# Patient Record
Sex: Female | Born: 1990 | Race: Black or African American | Hispanic: No | Marital: Single | Smoking: Never smoker
Health system: Southern US, Community
[De-identification: ages and names within clinical notes are randomized; demographics above are authoritative.]

## PROBLEM LIST (undated history)

## (undated) DIAGNOSIS — M329 Systemic lupus erythematosus, unspecified: Secondary | ICD-10-CM

## (undated) DIAGNOSIS — IMO0002 Reserved for concepts with insufficient information to code with codable children: Secondary | ICD-10-CM

## (undated) DIAGNOSIS — R011 Cardiac murmur, unspecified: Secondary | ICD-10-CM

## (undated) DIAGNOSIS — D571 Sickle-cell disease without crisis: Secondary | ICD-10-CM

## (undated) HISTORY — DX: Sickle-cell disease without crisis: D57.1

## (undated) HISTORY — PX: CHOLECYSTECTOMY: SHX55

---
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
## 2010-06-03 LAB — CBC
HCT: 17.6 % — ABNORMAL LOW (ref 36.0–46.0)
MCHC: 36.4 g/dL — ABNORMAL HIGH (ref 30.0–36.0)
Platelets: 335 10*3/uL (ref 150–400)
RDW: 23.4 % — ABNORMAL HIGH (ref 11.5–15.5)
WBC: 11 10*3/uL — ABNORMAL HIGH (ref 4.0–10.5)

## 2010-06-03 LAB — DIFFERENTIAL
Basophils Relative: 0 % (ref 0–1)
Eosinophils Absolute: 0.1 10*3/uL (ref 0.0–0.7)
Eosinophils Relative: 1 % (ref 0–5)
Monocytes Absolute: 0.8 10*3/uL (ref 0.1–1.0)
Neutro Abs: 6.4 10*3/uL (ref 1.7–7.7)

## 2010-06-06 LAB — CROSSMATCH: ABO/RH(D): A POS

## 2010-06-06 LAB — COMPREHENSIVE METABOLIC PANEL
ALT: 24 U/L (ref 0–35)
Albumin: 3.8 g/dL (ref 3.5–5.2)
Alkaline Phosphatase: 59 U/L (ref 39–117)
CO2: 26 mEq/L (ref 19–32)
Calcium: 8.5 mg/dL (ref 8.4–10.5)
Calcium: 8.6 mg/dL (ref 8.4–10.5)
Creatinine, Ser: 0.43 mg/dL (ref 0.4–1.2)
GFR calc Af Amer: >60 mL/min (ref >60)
GFR calc Af Amer: >60 mL/min (ref >60)
GFR calc non Af Amer: >60 mL/min (ref >60)
Glucose, Bld: 109 mg/dL — ABNORMAL HIGH (ref 70–99)
Potassium: 3.8 mEq/L (ref 3.5–5.1)
Sodium: 138 mEq/L (ref 135–145)
Total Protein: 7.1 g/dL (ref 6.0–8.3)

## 2010-06-06 LAB — URINE MICROSCOPIC-ADD ON

## 2010-06-06 LAB — DIFFERENTIAL
Basophils Absolute: 0 10*3/uL (ref 0.0–0.1)
Basophils Relative: 0 % (ref 0–1)
Eosinophils Relative: 1 % (ref 0–5)
Lymphocytes Relative: 18 % (ref 12–46)
Lymphocytes Relative: 23 % (ref 12–46)
Monocytes Relative: 11 % (ref 3–12)
Monocytes Relative: 14 % — ABNORMAL HIGH (ref 3–12)
Neutro Abs: 7.8 10*3/uL — ABNORMAL HIGH (ref 1.7–7.7)
Neutrophils Relative %: 65 % (ref 43–77)
Neutrophils Relative %: 67 % (ref 43–77)

## 2010-06-06 LAB — CARDIAC PANEL(CRET KIN+CKTOT+MB+TROPI)
CK, MB: 0.6 ng/mL (ref 0.3–4.0)
CK, MB: 0.6 ng/mL (ref 0.3–4.0)
CK, MB: 0.7 ng/mL (ref 0.3–4.0)
Relative Index: INVALID (ref 0.0–2.5)
Relative Index: INVALID (ref 0.0–2.5)
Total CK: 45 U/L (ref 7–177)
Total CK: 45 U/L (ref 7–177)
Total CK: 57 U/L (ref 7–177)
Troponin I: 0.04 ng/mL (ref 0.00–0.06)
Troponin I: 0.09 ng/mL — ABNORMAL HIGH (ref 0.00–0.06)

## 2010-06-06 LAB — URINE CULTURE: Special Requests: NEGATIVE

## 2010-06-06 LAB — CBC
HCT: 19.8 % — ABNORMAL LOW (ref 36.0–46.0)
Hemoglobin: 6.8 g/dL — CL (ref 12.0–15.0)
Hemoglobin: 7.1 g/dL — ABNORMAL LOW (ref 12.0–15.0)
MCV: 99.2 fL (ref 78.0–100.0)
RBC: 1.97 MIL/uL — ABNORMAL LOW (ref 3.87–5.11)
RBC: 2.04 MIL/uL — ABNORMAL LOW (ref 3.87–5.11)
WBC: 12 10*3/uL — ABNORMAL HIGH (ref 4.0–10.5)

## 2010-06-06 LAB — RETICULOCYTES
RBC.: 2.07 MIL/uL — ABNORMAL LOW (ref 3.87–5.11)
Retic Ct Pct: 15.4 % — ABNORMAL HIGH (ref 0.4–3.1)
Retic Ct Pct: 9.9 % — ABNORMAL HIGH (ref 0.4–3.1)

## 2010-06-06 LAB — CULTURE, BLOOD (ROUTINE X 2)
Culture: NO GROWTH
Culture: NO GROWTH

## 2010-06-06 LAB — URINALYSIS, ROUTINE W REFLEX MICROSCOPIC
Bilirubin Urine: NEGATIVE
Glucose, UA: NEGATIVE mg/dL
Ketones, ur: NEGATIVE mg/dL
Nitrite: NEGATIVE
Protein, ur: 30 mg/dL — AB
Specific Gravity, Urine: 1.007 (ref 1.005–1.030)
Urobilinogen, UA: 1 mg/dL (ref 0.0–1.0)
Urobilinogen, UA: 2 mg/dL — ABNORMAL HIGH (ref 0.0–1.0)
pH: 6 (ref 5.0–8.0)

## 2010-06-06 LAB — ABO/RH: ABO/RH(D): A POS

## 2010-06-06 LAB — HEMOGLOBIN AND HEMATOCRIT, BLOOD: HCT: 24 % — ABNORMAL LOW (ref 36.0–46.0)

## 2010-06-06 LAB — BRAIN NATRIURETIC PEPTIDE: Pro B Natriuretic peptide (BNP): 252 pg/mL — ABNORMAL HIGH (ref 0.0–100.0)

## 2010-06-06 LAB — POCT PREGNANCY, URINE: Preg Test, Ur: NEGATIVE

## 2010-11-03 ENCOUNTER — Encounter (HOSPITAL_COMMUNITY): Payer: Self-pay

## 2010-11-03 ENCOUNTER — Inpatient Hospital Stay (HOSPITAL_COMMUNITY)
Admission: EM | Admit: 2010-11-03 | Discharge: 2010-11-05 | DRG: 395 | Disposition: A | Discharge status: Home / Self Care | Payer: BC Managed Care – PPO | Attending: Internal Medicine | Admitting: Internal Medicine

## 2010-11-03 ENCOUNTER — Emergency Department (HOSPITAL_COMMUNITY): Payer: BC Managed Care – PPO

## 2010-11-03 DIAGNOSIS — D5700 Hb-SS disease with crisis, unspecified: Principal | ICD-10-CM | POA: Diagnosis present

## 2010-11-03 DIAGNOSIS — J039 Acute tonsillitis, unspecified: Secondary | ICD-10-CM | POA: Diagnosis present

## 2010-11-03 DIAGNOSIS — D72829 Elevated white blood cell count, unspecified: Secondary | ICD-10-CM | POA: Diagnosis present

## 2010-11-03 DIAGNOSIS — E876 Hypokalemia: Secondary | ICD-10-CM | POA: Diagnosis present

## 2010-11-03 HISTORY — DX: Sickle-cell disease without crisis: D57.1

## 2010-11-03 LAB — CBC
HCT: 15.6 % — ABNORMAL LOW (ref 36.0–46.0)
Hemoglobin: 5.8 g/dL — CL (ref 12.0–15.0)
MCHC: 37.2 g/dL — ABNORMAL HIGH (ref 30.0–36.0)
WBC: 18.1 10*3/uL — ABNORMAL HIGH (ref 4.0–10.5)

## 2010-11-03 LAB — DIFFERENTIAL
Basophils Relative: 0 % (ref 0–1)
Eosinophils Relative: 1 % (ref 0–5)
Lymphocytes Relative: 21 % (ref 12–46)
Monocytes Relative: 4 % (ref 3–12)
Neutro Abs: 13.4 10*3/uL — ABNORMAL HIGH (ref 1.7–7.7)
Neutrophils Relative %: 74 % (ref 43–77)

## 2010-11-03 LAB — RETICULOCYTES
RBC.: 1.78 MIL/uL — ABNORMAL LOW (ref 3.87–5.11)
Retic Count, Absolute: 446.8 10*3/uL — ABNORMAL HIGH (ref 19.0–186.0)
Retic Ct Pct: 25.1 % — ABNORMAL HIGH (ref 0.4–3.1)

## 2010-11-03 LAB — URINALYSIS, ROUTINE W REFLEX MICROSCOPIC
Glucose, UA: NEGATIVE mg/dL
Leukocytes, UA: NEGATIVE
Protein, ur: 100 mg/dL — AB
pH: 6 (ref 5.0–8.0)

## 2010-11-03 LAB — COMPREHENSIVE METABOLIC PANEL
AST: 56 U/L — ABNORMAL HIGH (ref 0–37)
Albumin: 3.6 g/dL (ref 3.5–5.2)
Alkaline Phosphatase: 58 U/L (ref 39–117)
BUN: 6 mg/dL (ref 6–23)
Chloride: 107 mEq/L (ref 96–112)
Creatinine, Ser: <0.47 mg/dL — ABNORMAL LOW (ref 0.50–1.10)
Potassium: 3.3 mEq/L — ABNORMAL LOW (ref 3.5–5.1)
Total Bilirubin: 2.9 mg/dL — ABNORMAL HIGH (ref 0.3–1.2)
Total Protein: 7.5 g/dL (ref 6.0–8.3)

## 2010-11-03 LAB — RAPID STREP SCREEN (MED CTR MEBANE ONLY): Streptococcus, Group A Screen (Direct): NEGATIVE

## 2010-11-03 LAB — URINE MICROSCOPIC-ADD ON

## 2010-11-04 LAB — BASIC METABOLIC PANEL
BUN: 3 mg/dL — ABNORMAL LOW (ref 6–23)
Creatinine, Ser: <0.47 mg/dL — ABNORMAL LOW (ref 0.50–1.10)
Glucose, Bld: 80 mg/dL (ref 70–99)
Potassium: 4 mEq/L (ref 3.5–5.1)

## 2010-11-04 LAB — CBC
HCT: 19.6 % — ABNORMAL LOW (ref 36.0–46.0)
Hemoglobin: 7.1 g/dL — ABNORMAL LOW (ref 12.0–15.0)
MCH: 31.8 pg (ref 26.0–34.0)
MCHC: 36.2 g/dL — ABNORMAL HIGH (ref 30.0–36.0)
MCV: 87.9 fL (ref 78.0–100.0)
RDW: 20.5 % — ABNORMAL HIGH (ref 11.5–15.5)

## 2010-11-04 NOTE — H&P (Signed)
NAMEBELLA, BRUMMET NO.:  [id number]  MEDICAL RECORD NO.:  [id number]  LOCATION:  1319                         FACILITY:  [facility]  PHYSICIAN:  Hillery Aldo, M.D.   DATE OF BIRTH:  June 29, 1990  DATE OF ADMISSION:  11/03/2010 DATE OF DISCHARGE:                             HISTORY & PHYSICAL   PRIMARY CARE PHYSICIAN:  Dr. Rubye Oaks in [place].  SICKLE CELL SPECIALIST:  Dr. Rayvon Char at [facility] in [place].  CHIEF COMPLAINT:  "Pain all over."  HISTORY OF PRESENT ILLNESS:  The patient is a 20 year old female with past medical history of sickle cell anemia who awoke at approximately 3 a.m. with pain all over similar to her usual sickle cell crisis pain. The patient states she took Advil PM without significant relief, and when the pain became more prominent in her chest and joints, she called 911 for transport to the hospital for further evaluation.  The patient states that she had expired pain medications from her prior hospitalization about a year ago, but was afraid to take these.  She reports recent cough and swollen tonsils, but denies any significant dyspnea.  Upon initial evaluation in the emergency department, she was found to have a hemoglobin of 5.8.  She states she is currently on her menstrual period and suffers with prominent menstrual cramping and fatigue related to this, and she usually takes Midol and Motrin as needed.  Because of her low hemoglobin and sickle cell crisis, she has been referred to the hospitalist service for further inpatient evaluation and treatment.  PAST MEDICAL HISTORY: 1. Sickle cell anemia, hemoglobin SS with history of hospitalization     for crisis. 2. History of recurrent tonsillitis. 3. History of staph abscess to the right lower extremity. 4. History of bronchitis.  PAST SURGICAL HISTORY:  Cholecystectomy at age 26.  FAMILY HISTORY:  The patient's father died at 19 from stomach cancer. The patient's  mother is alive at 68 and has sickle cell trait, but is otherwise healthy.  She has 2 healthy siblings.  Diabetes, breast cancer, and hypertension run in her extended family.  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  She is a journalism and mass communications major, pre-law. She denies any history of tobacco, alcohol, or drug use.  ALLERGIES:  No known drug allergies.  CURRENT MEDICATIONS: 1. Tylenol 650 mg p.o. q.4 h. p.r.n. pain. 2. Motrin 400 mg p.o. q.8 h. p.r.n. pain. 3. Folic acid 0.4 mg p.o. daily. 4. Midol p.r.n.  REVIEW OF SYSTEMS:  CONSTITUTIONAL:  Positive for fatigue and recent fever and chills within the past 2 weeks.  Appetite has been good.  No weight loss.  HEENT:  No complaints except for swollen tonsils and hoarseness.  CARDIOVASCULAR:  No chest pain or dysrhythmia. RESPIRATORY:  No dyspnea.  Positive cough.  GI:  No nausea, vomiting, diarrhea, melena, or hematochezia.  GU:  No dysuria or hematuria.  HEME: The patient states that her hemoglobin normally runs 7.5 to 8.  She does have a history of prior transfusions.  MUSCULOSKELETAL:  Positive for arthralgias and myalgias.  NEUROLOGIC:  No complaints.  Comprehensive  14-point review of systems is otherwise unremarkable.  PHYSICAL EXAMINATION:  VITAL SIGNS:  Temperature 97.9, pulse 66, respirations 16, blood pressure 111/58, O2 saturation 100% on 4 L. GENERAL:  Well-developed, well-nourished [place] American female, in no acute distress. HEENT:  Normocephalic, atraumatic.  PERRL with mild scleral icterus. EOMI.  Oropharynx reveals enlarged tonsils bilaterally with some purulent drainage noted on the right tonsil.  Mucous membranes are moist. NECK:  Supple.  Mild cervical lymphadenopathy.  No thyromegaly. CHEST:  Lungs clear to auscultation bilaterally with good air movement. HEART:  Regular rate, rhythm.  No murmurs, rubs, or gallops. ABDOMEN:  Soft, nontender, nondistended  with normoactive bowel sounds. EXTREMITIES:  No clubbing, edema, cyanosis. SKIN:  Dry.  No rashes. NEUROLOGIC:  The patient is alert and oriented x3.  Cranial nerves II through XII are grossly intact.  DATA REVIEW:  Chest x-ray shows no active pulmonary disease.  LABORATORY DATA:  Urine pregnancy testing is negative.  Reticulocyte count is 25.1.  White blood cell count is 18.1, hemoglobin 5.8, hematocrit 15.6, and platelets 292,000.  Sodium is 139, potassium 3.3, chloride 107, bicarb 22, BUN 6, creatinine less than 0.47, glucose 85, calcium 8.9, total bilirubin 2.9, alkaline phosphatase 58, AST 56, ALT 17, total protein 7.5, and albumin 3.6.  Urinalysis is negative for signs of infection.  ASSESSMENT AND PLAN: 1. Sickle cell anemia with vaso-occlusive crisis:  We will admit the     patient and administer IV fluids and pain medications to get her     crisis under control.  We will start her on folic acid     supplementation along with multivitamin. 2. Hypokalemia:  Replace the patient's potassium p.o. and supplement     her in her IV fluids. 3. Anemia secondary to sickle cell anemia and menses:  The patient     will need a unit of blood and we will aim for a hemoglobin value of     7.5 to 8, which is her baseline. 4. Tonsillitis:  The patient does have active tonsillitis and we will     start her on Augmentin and check a strep throat screen. 5. Leukocytosis:  Likely due to an acute stress reaction and     tonsillitis.  We will monitor her white blood cell     count. 6. Prophylaxis:  Initiate DVT prophylaxis with Lovenox.  Time spent on admission including face-to-face time equals approximately 1 hour.     Hillery Aldo, M.D.     CR/MEDQ  D:  11/03/2010  T:  11/03/2010  Job:  [id number]  cc:   Dr. Rayvon Char  Dr. Rubye Oaks  Electronically Signed by Hillery Aldo M.D. on 11/04/2010 07:19:53 AM

## 2010-11-05 LAB — CBC
Hemoglobin: 7.3 g/dL — ABNORMAL LOW (ref 12.0–15.0)
MCH: 31.7 pg (ref 26.0–34.0)
MCHC: 36 g/dL (ref 30.0–36.0)
RDW: 22.3 % — ABNORMAL HIGH (ref 11.5–15.5)

## 2010-11-07 LAB — CROSSMATCH
ABO/RH(D): A POS
Antibody Screen: NEGATIVE
Unit division: 0

## 2010-11-23 NOTE — Discharge Summary (Signed)
[name], [name] NO.:  [id number]  MEDICAL RECORD NO.:  [id number]  LOCATION:  1319                         FACILITY:  [facility]  PHYSICIAN:  Hillery Aldo, M.D.   DATE OF BIRTH:  01-Feb-1991  DATE OF ADMISSION:  11/03/2010 DATE OF DISCHARGE:  11/05/2010                              DISCHARGE SUMMARY   PRIMARY CARE PHYSICIAN:  Dr. Rubye Oaks in [place].  SICKLE CELL SPECIALIST:  Dr. Rayvon Char.  DISCHARGE DIAGNOSES: 1. Sickle cell anemia - SS disease. 2. Vaso-occlusive crisis. 3. Anemia secondary to sickle cell anemia, status post 1 unit of     packed red blood cells. 4. Hypokalemia, repleted. 5. Tonsillitis/leukocytosis, improved.  DISCHARGE MEDICATIONS: 1. Augmentin 875 mg p.o. b.i.d. for 6 more days. 2. Oxycodone 5-10 mg p.o. q.4 h. p.r.n. pain. 3. Tylenol 650 mg p.o. q.4 h. p.r.n. pain. 4. Folic acid 0.1 mg p.o. daily. 5. Motrin 400 mg p.o. q.8 h. p.r.n. pain.  CONSULTATIONS:  None.  BRIEF ADMISSION HISTORY OF PRESENT ILLNESS:  The patient is a 20 year old female with past medical history of sickle cell anemia who awoke at 3 a.m. on the date of admission with diffuse body pain and myalgias/arthralgias.  She presented to the hospital when she could not obtain relief with over-the-counter pain medications and upon initial evaluation by the emergency department physicians, was found to have a hemoglobin of 5.8 who subsequently referred to the hospitalist service for further evaluation and treatment.  For full details, please see my dictated H and P.  PROCEDURES AND DIAGNOSTIC STUDIES:  Chest x-ray on November 03, 2010, showed no evidence of active pulmonary disease.  DISCHARGE LABORATORY VALUES:  White blood cell count is 10.3, hemoglobin 7.3, hematocrit 20.3, and platelets 327,000.  Sodium is 139, potassium is 4.0, chloride 108, bicarb 26, BUN 3, creatinine less than 0.47, glucose 80, and calcium 9.0.  Rapid strep screen was negative. Urinalysis was  negative for signs of infection, but did have 100 mg/dL of protein.  Reticulocyte count on admission was 25.1 and not repeated. Bilirubin was 2.9 on admission and not repeated.  HOSPITAL COURSE BY PROBLEM: 1. Sickle cell anemia with vaso-occlusive crisis:  The patient was put     on empiric IV fluids and pain medications as well as folic acid.     Within 24 hours, she was reporting significant improvement of her     pain.  At 48 hours, the patient was stable for discharge and will     follow up with her treating physicians in [place].  She will be     discharged with a prescription for oxycodone to take for any     residual pain. 2. Anemia secondary to sickle cell anemia:  The patient received 1     unit of packed red blood cells with a rise in her hemoglobin to     7.1.  Her discharge hemoglobin is 7.3.  She was instructed to     return to the physicians at her university if she develops any     problems with shortness of breath, fatigability, or     lightheadedness. 3. Hypokalemia:  The patient's potassium is fully repleted.  4. Tonsillitis/leukocytosis:  The patient was put on Augmentin.  Rapid     strep test was negative.  Her leukocytosis resolved with empiric     antibiotics and she will complete an additional 6 days of Augmentin     for a total treatment course of 7 days of therapy.  DISPOSITION:  The patient is medically stable and will be discharged home.  DISCHARGE INSTRUCTIONS:  Activity:  Increase activity slowly. Diet:  No restrictions. Followup:  With Dr. Colt/Dr. Rayvon Char as needed.  Time spent coordinating care for discharge and discharge instructions, and face-to-face time equals 25 minutes.     Hillery Aldo, M.D.     CR/MEDQ  D:  11/05/2010  T:  11/05/2010  Job:  [id number]  cc:   Dr. Vickey Sages  Dr. Vivi Ferns  Electronically Signed by Hillery Aldo M.D. on 11/23/2010 05:19:47 PM

## 2011-04-12 ENCOUNTER — Ambulatory Visit (HOSPITAL_BASED_OUTPATIENT_CLINIC_OR_DEPARTMENT_OTHER): Payer: BC Managed Care – PPO | Admitting: Hematology & Oncology

## 2011-04-12 ENCOUNTER — Other Ambulatory Visit (HOSPITAL_BASED_OUTPATIENT_CLINIC_OR_DEPARTMENT_OTHER): Payer: BC Managed Care – PPO | Admitting: Lab

## 2011-04-12 ENCOUNTER — Encounter: Payer: Self-pay | Admitting: Hematology & Oncology

## 2011-04-12 VITALS — BP 109/67 | HR 95 | Temp 97.6°F | Ht 67.0 in | Wt 137.0 lb

## 2011-04-12 DIAGNOSIS — D571 Sickle-cell disease without crisis: Secondary | ICD-10-CM

## 2011-04-12 HISTORY — DX: Sickle-cell disease without crisis: D57.1

## 2011-04-12 LAB — CBC WITH DIFFERENTIAL (CANCER CENTER ONLY)
Eosinophils Absolute: 0.1 10*3/uL (ref 0.0–0.5)
HGB: 6.7 g/dL — CL (ref 11.6–15.9)
LYMPH#: 5.5 10*3/uL — ABNORMAL HIGH (ref 0.9–3.3)
MONO#: 1.6 10*3/uL — ABNORMAL HIGH (ref 0.1–0.9)
MONO%: 11.2 % (ref 0.0–13.0)
NEUT#: 7.1 10*3/uL — ABNORMAL HIGH (ref 1.5–6.5)
Platelets: 202 10*3/uL (ref 145–400)
RBC: 2.14 10*6/uL — ABNORMAL LOW (ref 3.70–5.32)
WBC: 14.3 10*3/uL — ABNORMAL HIGH (ref 3.9–10.0)

## 2011-04-12 NOTE — Progress Notes (Signed)
This office note has been dictated.

## 2011-04-12 NOTE — Progress Notes (Signed)
DIAGNOSES: 1. Hemoglobin SS disease. 2. Recent diagnosis of systemic lupus.  HISTORY OF PRESENT ILLNESS:  [name] is a very nice 21 year old African American female.  She goes to school at [employer].  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  She has hemoglobin SS disease.  She really has had very few problems with this.  She was last admitted for a couple of days.  She was treated conservatively.  She recently was found to have lupus.  Again, she is being followed and being seen by a rheumatologist down in [place].  She was started on prednisone and Plaquenil.  She really has very rare crises.  She has not had any complications from the lupus to date.  She has had no vascular ulcers in her legs.  She has had to have her gallbladder out.  She apparently had this out when she was 21 years old.  She has had no problem with fever.  There is no cough.  She is not fatigued.  She says that her hemoglobin typically is between 6 and 7.  There has been no change in bowel or bladder habits.  She has not had any change or alterations with her menstrual cycles.  She presented with the lupus with joint swelling and pain.  She has had no double vision, blurred vision.  There has been no dysphagia or odynophagia.  We were asked to follow up with her while she is in [place] for college.  PAST MEDICAL HISTORY:  Remarkable for: 1. Hemoglobin SS disease. 2. Systemic lupus. 3. Recurrent tonsillitis.  ALLERGIES:  None.  CURRENT MEDICATIONS: 1. Fosamax 70 mg p.o. q.week. 2. Folic acid 1 mg p.o. daily. 3. Plaquenil 200 mg p.o. b.i.d. 4. Prednisone 10 mg p.o. daily. 5. Vitamin D 50,000 units q.week.  SOCIAL HISTORY:  Negative for tobacco or alcohol use.  She has no obvious occupational exposures.  FAMILY HISTORY:  Remarkable for mother who has sickle cell trait.  Her father passed away from stomach cancer at 21 years old.  She has 2 siblings.  There is no sickle cell  with them.  REVIEW OF SYSTEMS:  Is as stated in history of present illness.  No additional findings noted on a 12 system review.  PHYSICAL EXAMINATION:  General:  This is a well-developed, well- nourished, African American female in no obvious distress.  Vital signs: Show a temperature 97.6, pulse 95, respiratory rate 18, blood pressure 109/67.  Weight is 137.  Head and neck:  Exam shows a normocephalic, atraumatic skull.  Shows no ocular or oral lesions.  There is no scleral icterus.  Her tonsils are quite enlarged but not inflamed.  Neck:  She has no adenopathy in her neck.  Her submandibular glands might be slightly prominent.  Thyroid is not palpable.  Lungs:  Are clear to percussion and auscultation bilaterally.  Cardiac:  Regular rate and rhythm with normal S1, S2.  She has a 1/6 systolic ejection murmur. Abdomen:  Soft with good bowel sounds.  There was no palpable abdominal mass.  There was no fluid wave.  There was no palpable hepatosplenomegaly.  Back:  No tenderness over the spine, ribs or hips. Extremities:  Show no clubbing, cyanosis or edema.  Neurological:  Exam shows no focal neurological deficits.  Skin:  No rashes, ecchymoses or petechiae.  LABORATORY STUDIES:  White cell count 14.3, hemoglobin 6.7, hematocrit 19, platelet count 202.  MCV is 89.  Peripheral smear shows  mild anisocytosis and poikilocytosis.  She has a couple microcytic red cells.  There are no teardrop cells.  There are no nucleated red blood cells.  There may be some slight polychromasia.  I did not see any rouleaux formation.  There were a couple target cells. Her white cells are slightly increased in number.  The white cells showed predominance of polys.  She has no immature myeloid or lymphoid forms.  There are no atypical lymphocytes.  Platelets are adequate number and size.  IMPRESSION:  [name] is a very nice 21 year old African American female with hemoglobin SS disease.  We are sending  off a hemoglobin electrophoresis on her so we can confirm this.  She has done very well for sickle cell in which she has had very few crises.  To me, it seems this new diagnosis of lupus might be more of a problem for her.  She is on prednisone and Plaquenil for this.  Her lupus is being followed by her rheumatologist down in [place].  From my point of view, [name] clearly is benefitting from folic acid.  We will see what her hemoglobin electrophoresis shows.  We will see what her hemoglobin F level is.  One would have to think then if she has an elevated hemoglobin F percentage that it is protective against crises.  Her tonsils certainly are enlarged.  This may be an issue with respect to her having her tonsils taken out.  I told [name] that if she ever needed to have a tonsillectomy that we would probably need to do a "mini" exchange transfusion before surgery so that she gets her sickle cell diluted.  I really do not think we have to go her back to the office on a regular basis.  She is busy in college.  I told her that if she ever needed [place] to see her or she ever needed to have blood work done, that we could certainly get her in for lab work and to evaluate her.  I told [name] that my goal was to make sure that she stays out of the hospital as much as possible.  We can try to do a lot of work in the office.  We can give her fluids in the office.  We can give her IV pain medications if she needs it.  We spent a good hour or so with [name] today.  I answered the questions that she had.    ______________________________ [name], M.D. PRE/MEDQ  D:  04/12/2011  T:  04/12/2011  Job:  [id number]  ADDENDUM:  Ferritin is 542.  %Sat is 35.  EPO is 41.

## 2011-04-15 LAB — RETICULOCYTES (CHCC)
ABS Retic: 381.3 10*3/uL — ABNORMAL HIGH (ref 19.0–186.0)
RBC.: 2.23 MIL/uL — ABNORMAL LOW (ref 3.87–5.11)
Retic Ct Pct: 17.1 % — ABNORMAL HIGH (ref 0.4–2.3)

## 2011-04-15 LAB — ERYTHROPOIETIN: Erythropoietin: 41.1 m[IU]/mL — ABNORMAL HIGH (ref 2.6–34.0)

## 2011-04-15 LAB — HEMOGLOBINOPATHY EVALUATION
Hgb A2 Quant: 3 % (ref 2.2–3.2)
Hgb A: 27.5 % — ABNORMAL LOW (ref 96.8–97.8)
Hgb F Quant: 6.1 % — ABNORMAL HIGH (ref 0.0–2.0)

## 2011-04-15 LAB — IRON AND TIBC: UIBC: 173 ug/dL (ref 125–400)

## 2011-04-15 LAB — FERRITIN: Ferritin: 547 ng/mL — ABNORMAL HIGH (ref 10–291)

## 2011-05-04 ENCOUNTER — Other Ambulatory Visit: Payer: Self-pay | Admitting: *Deleted

## 2011-05-04 NOTE — Telephone Encounter (Signed)
Pt called with c/o pain stating "my pain medication is no longer strong enough". Returned call, no answer. Left message asking her to call back with the type and amount of medication she is taking as her list and office note does not contain any pain medication.  Of note: Dr Myna Hidalgo stated once this was known we could proceed from there but to offer her IV hydration and pain medication tomorrow if needed.

## 2011-05-08 ENCOUNTER — Encounter (HOSPITAL_COMMUNITY): Payer: Self-pay | Admitting: *Deleted

## 2011-05-08 ENCOUNTER — Emergency Department (HOSPITAL_COMMUNITY)
Admission: EM | Admit: 2011-05-08 | Discharge: 2011-05-08 | Disposition: A | Discharge status: Home / Self Care | Payer: BC Managed Care – PPO | Source: Home / Self Care | Attending: Family Medicine | Admitting: Family Medicine

## 2011-05-08 DIAGNOSIS — Z202 Contact with and (suspected) exposure to infections with a predominantly sexual mode of transmission: Secondary | ICD-10-CM

## 2011-05-08 HISTORY — DX: Reserved for concepts with insufficient information to code with codable children: IMO0002

## 2011-05-08 HISTORY — DX: Systemic lupus erythematosus, unspecified: M32.9

## 2011-05-08 LAB — WET PREP, GENITAL: Trich, Wet Prep: NONE SEEN

## 2011-05-08 LAB — HIV ANTIBODY (ROUTINE TESTING W REFLEX): HIV: NONREACTIVE

## 2011-05-08 LAB — RPR: RPR Ser Ql: NONREACTIVE

## 2011-05-08 MED ORDER — LIDOCAINE VISCOUS 2 % MT SOLN: OROMUCOSAL | Status: DC

## 2011-05-08 MED ORDER — CEFTRIAXONE SODIUM 250 MG IJ SOLR
250.0000 mg | Freq: Once | INTRAMUSCULAR | Status: AC
  Administered 2011-05-08: 250 mg via INTRAMUSCULAR

## 2011-05-08 MED ORDER — CEFTRIAXONE SODIUM 250 MG IJ SOLR
INTRAMUSCULAR | Status: AC
  Filled 2011-05-08: qty 250

## 2011-05-08 MED ORDER — AZITHROMYCIN 250 MG PO TABS
ORAL_TABLET | ORAL | Status: AC
  Filled 2011-05-08: qty 4

## 2011-05-08 MED ORDER — ACYCLOVIR 400 MG PO TABS: 400.0000 mg | ORAL_TABLET | Freq: Three times a day (TID) | ORAL | Status: AC

## 2011-05-08 MED ORDER — FLUCONAZOLE 200 MG PO TABS: 200.0000 mg | ORAL_TABLET | Freq: Every day | ORAL | Status: AC

## 2011-05-08 MED ORDER — AZITHROMYCIN 250 MG PO TABS
1000.0000 mg | ORAL_TABLET | Freq: Once | ORAL | Status: AC
  Administered 2011-05-08: 1000 mg via ORAL

## 2011-05-08 MED ORDER — IBUPROFEN 800 MG PO TABS: 800.0000 mg | ORAL_TABLET | Freq: Three times a day (TID) | ORAL | Status: DC

## 2011-05-08 MED ORDER — LIDOCAINE HCL (PF) 1 % IJ SOLN
INTRAMUSCULAR | Status: AC
  Filled 2011-05-08: qty 5

## 2011-05-08 NOTE — ED Notes (Signed)
No adverse reaction to injection - reviewed with pt std treatment - nurse would call when results available pt would have to present in person for HIV results negative or positive

## 2011-05-08 NOTE — ED Provider Notes (Signed)
History     CSN: [id number]  Arrival date & time 05/08/11  0954  10:41 AM HPI Patient reports she was told yesterday by her boyfriend had a previous girlfriend had a sexual transmitted disease. States he believes he was told it was gonorrhea and herpes. Patient reports for several days had lesions in her vaginal and vulvar area. Ports lesions are extremely painful. Also reports a white thick vaginal discharge like a yeast infection. Reports dysuria but denies urinary frequency or urgency. Denies abdominal pain, nausea, vomiting.  Patient is a 21 y.o. female presenting with STD exposure. The history is provided by the patient.  Exposure to STD This is a new problem. The problem has been gradually worsening. Pertinent negatives include no abdominal pain. Associated symptoms comments: Vaginal and vulvar lesions. Vaginal discharge.. The symptoms are aggravated by intercourse (palpation, urination). She has tried nothing for the symptoms.    Past Medical History  Diagnosis Date  . Sickle cell anemia   . Sickle cell disease, type SS 04/12/2011    No past surgical history on file.  No family history on file.  History  Substance Use Topics  . Smoking status: Not on file  . Smokeless tobacco: Not on file  . Alcohol Use: Not on file    OB History    Grav Para Term Preterm Abortions TAB SAB Ect Mult Living                  Review of Systems  Constitutional: Negative for fever and chills.  Gastrointestinal: Negative for nausea, vomiting and abdominal pain.  Genitourinary: Positive for dysuria, vaginal discharge, genital sores and vaginal pain. Negative for urgency, hematuria, flank pain and vaginal bleeding.  Musculoskeletal: Negative for back pain.  All other systems reviewed and are negative.    Allergies  Review of patient's allergies indicates no known allergies.  Home Medications   Current Outpatient Rx  Name Route Sig Dispense Refill  . ALENDRONATE SODIUM 70 MG PO TABS  Oral Take 70 mg by mouth Once a week.    Marland Kitchen FOLIC ACID 1 MG PO TABS Oral Take 1 mg by mouth Daily.    Marland Kitchen HYDROXYCHLOROQUINE SULFATE 200 MG PO TABS Oral Take 200 mg by mouth Twice daily.    Marland Kitchen PREDNISONE 10 MG PO TABS Oral Take 10 mg by mouth.    Marland Kitchen VITAMIN D (ERGOCALCIFEROL) 50000 UNITS PO CAPS Oral Take 50,000 Units by mouth. Take 1 pill mon, wed, fri      BP 114/68  Pulse 89  Temp(Src) 97.8 F (36.6 C) (Oral)  Resp 20  SpO2 96%  Physical Exam  Vitals reviewed. Constitutional: She is oriented to person, place, and time. Vital signs are normal. She appears well-developed and well-nourished. No distress.  HENT:  Head: Normocephalic and atraumatic.  Eyes: Conjunctivae are normal. Pupils are equal, round, and reactive to light.  Neck: Normal range of motion. Neck supple.  Cardiovascular: Normal rate, regular rhythm and normal heart sounds.  Exam reveals no friction rub.   No murmur heard. Pulmonary/Chest: Effort normal and breath sounds normal. She has no wheezes. She has no rhonchi. She has no rales. She exhibits no tenderness.  Abdominal: Soft. Bowel sounds are normal. She exhibits no distension and no mass. There is no tenderness. There is no rebound and no guarding.  Genitourinary: Uterus normal. There is lesion (multiple ulcerations and crusted lesion ) on the right labia. There is lesion ( multiple ulcerations and crusted lesion ) on the  left labia. Cervix exhibits no motion tenderness and no discharge. Right adnexum displays no tenderness. Left adnexum displays no tenderness. There is tenderness around the vagina. Vaginal discharge (white thick) found.  Musculoskeletal: Normal range of motion.  Neurological: She is alert and oriented to person, place, and time. Coordination normal.  Skin: Skin is warm and dry. No rash noted. No erythema. No pallor.    ED Course  Procedures    MDM    Will treat empirically for gonorrhea, Chlamydia, and genital herpes. Advised patient to return  for test results about 3 days.      Thomasene Lot, PA-C 05/08/11 1125

## 2011-05-08 NOTE — Discharge Instructions (Signed)
I provided basic information regarding various sexually transmitted diseases below. Please return to determine results of your tests. Today he had been treated empirically for gonorrhea, Chlamydia, and herpes simplex. Is advised not to have sexual intercourse for at least 7 days after both you and your partner have been treated or else you risk  reinfection.

## 2011-05-08 NOTE — ED Notes (Signed)
Per pt told by partner that previous girlfriend tested positive STD

## 2011-05-09 ENCOUNTER — Encounter (HOSPITAL_COMMUNITY): Payer: Self-pay | Admitting: *Deleted

## 2011-05-09 ENCOUNTER — Telehealth (HOSPITAL_COMMUNITY): Payer: Self-pay | Admitting: *Deleted

## 2011-05-09 LAB — GC/CHLAMYDIA PROBE AMP, GENITAL: Chlamydia, DNA Probe: NEGATIVE

## 2011-05-09 NOTE — ED Provider Notes (Signed)
Medical screening examination/treatment/procedure(s) were performed by non-physician practitioner and as supervising physician I was immediately available for consultation/collaboration.   [facility]; MD   Sharin Grave, MD 05/09/11 [phone]

## 2011-05-09 NOTE — ED Notes (Signed)
Pt. called for her lab results. Pt. verified x 2 and given results except HIV and told that Herpes was pending. (GC/Chlamydia neg.,  Wet prep: Few clue cells, mod. Yeast, and few WBC's, HIV and RPR non-reactive). Pt. told she she would need to come with a picture ID to see the HIV result.   Pt. told I would only call her back if Herpes was pos. Pt. got Rx.'s for Acyclovir and Diflucan but has not filled them yet. She is concerned that it will interfere with her Lupus medicine. I told her she could talk to the pharmacist about this. Lupus was not on her hx. and I added it. I instructed her to start her medicine if not contraindicated. Vassie Moselle 05/09/2011

## 2011-05-11 LAB — HERPES SIMPLEX VIRUS CULTURE: Culture: DETECTED

## 2011-05-12 ENCOUNTER — Telehealth (HOSPITAL_COMMUNITY): Payer: Self-pay | Admitting: *Deleted

## 2011-05-12 NOTE — ED Notes (Signed)
Pt. 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 Pt. told shew as adequately treated with the Acyclovir. Pt. instructed to notify her partner, no sex until you finish the medication. You can pass the virus even when you don't have an outbreak, so always practice safe sex. Get treated for each outbreak with Acyclovir or Valtrex. I suggested she get an OB-GYN doctor that could call in Rx.'s for her with each outbreak. Pt. voiced understanding. Vassie Moselle 05/12/2011

## 2011-05-18 ENCOUNTER — Other Ambulatory Visit: Payer: Self-pay | Admitting: *Deleted

## 2011-05-18 ENCOUNTER — Other Ambulatory Visit: Payer: Self-pay | Admitting: Hematology & Oncology

## 2011-05-18 DIAGNOSIS — D571 Sickle-cell disease without crisis: Secondary | ICD-10-CM

## 2011-05-18 MED ORDER — OXYCODONE HCL 15 MG PO TABS: 15.0000 mg | ORAL_TABLET | Freq: Four times a day (QID) | ORAL | Status: DC | PRN

## 2011-05-18 MED ORDER — OXYCODONE-IBUPROFEN 5-400 MG PO TABS: ORAL_TABLET | ORAL | Status: DC

## 2011-05-18 NOTE — Telephone Encounter (Signed)
Pt called with c/o 2 recent sickle cell crisis. The oxycodone 5 mg tabs (1 -2 ) q 4-6 hours prn "doesn't seem to be helping as much as it used to". Reviewed with Dr Myna Hidalgo. After back and forth discussion with the pt, it was decided to increase her oxycodone to 15 mg q 6 hrs prn. She was offered IVF and pain medications in the office but declined. She understands she can call in the future to set up appts if needed.

## 2011-07-06 ENCOUNTER — Emergency Department (HOSPITAL_COMMUNITY): Payer: BC Managed Care – PPO

## 2011-07-06 ENCOUNTER — Emergency Department (HOSPITAL_COMMUNITY)
Admission: EM | Admit: 2011-07-06 | Discharge: 2011-07-06 | Disposition: A | Discharge status: Home / Self Care | Payer: BC Managed Care – PPO | Attending: Emergency Medicine | Admitting: Emergency Medicine

## 2011-07-06 ENCOUNTER — Encounter (HOSPITAL_COMMUNITY): Payer: Self-pay | Admitting: *Deleted

## 2011-07-06 DIAGNOSIS — R079 Chest pain, unspecified: Secondary | ICD-10-CM | POA: Insufficient documentation

## 2011-07-06 DIAGNOSIS — M25529 Pain in unspecified elbow: Secondary | ICD-10-CM | POA: Insufficient documentation

## 2011-07-06 DIAGNOSIS — R0602 Shortness of breath: Secondary | ICD-10-CM | POA: Insufficient documentation

## 2011-07-06 DIAGNOSIS — M25569 Pain in unspecified knee: Secondary | ICD-10-CM | POA: Insufficient documentation

## 2011-07-06 DIAGNOSIS — D5700 Hb-SS disease with crisis, unspecified: Secondary | ICD-10-CM | POA: Insufficient documentation

## 2011-07-06 DIAGNOSIS — M329 Systemic lupus erythematosus, unspecified: Secondary | ICD-10-CM | POA: Insufficient documentation

## 2011-07-06 DIAGNOSIS — M545 Low back pain, unspecified: Secondary | ICD-10-CM | POA: Insufficient documentation

## 2011-07-06 DIAGNOSIS — R011 Cardiac murmur, unspecified: Secondary | ICD-10-CM | POA: Insufficient documentation

## 2011-07-06 DIAGNOSIS — K117 Disturbances of salivary secretion: Secondary | ICD-10-CM | POA: Insufficient documentation

## 2011-07-06 DIAGNOSIS — Z79899 Other long term (current) drug therapy: Secondary | ICD-10-CM | POA: Insufficient documentation

## 2011-07-06 LAB — URINE MICROSCOPIC-ADD ON

## 2011-07-06 LAB — RETICULOCYTES
RBC.: 2.2 MIL/uL — ABNORMAL LOW (ref 3.87–5.11)
Retic Count, Absolute: 827.2 10*3/uL — ABNORMAL HIGH (ref 19.0–186.0)
Retic Ct Pct: 37.6 % — ABNORMAL HIGH (ref 0.4–3.1)

## 2011-07-06 LAB — URINALYSIS, ROUTINE W REFLEX MICROSCOPIC
Bilirubin Urine: NEGATIVE
Nitrite: NEGATIVE
Protein, ur: NEGATIVE mg/dL
Specific Gravity, Urine: 1.015 (ref 1.005–1.030)
Urobilinogen, UA: 1 mg/dL (ref 0.0–1.0)

## 2011-07-06 LAB — COMPREHENSIVE METABOLIC PANEL
AST: 42 U/L — ABNORMAL HIGH (ref 0–37)
Albumin: 4 g/dL (ref 3.5–5.2)
BUN: 6 mg/dL (ref 6–23)
CO2: 24 mEq/L (ref 19–32)
Calcium: 9 mg/dL (ref 8.4–10.5)
Creatinine, Ser: 0.46 mg/dL — ABNORMAL LOW (ref 0.50–1.10)
GFR calc non Af Amer: >90 mL/min (ref >90)
Total Bilirubin: 4.1 mg/dL — ABNORMAL HIGH (ref 0.3–1.2)

## 2011-07-06 LAB — DIFFERENTIAL
Eosinophils Absolute: 0 10*3/uL (ref 0.0–0.7)
Lymphs Abs: 4.2 10*3/uL — ABNORMAL HIGH (ref 0.7–4.0)
Monocytes Absolute: 2.1 10*3/uL — ABNORMAL HIGH (ref 0.1–1.0)
Neutrophils Relative %: 73 % (ref 43–77)

## 2011-07-06 LAB — CBC
MCH: 32.7 pg (ref 26.0–34.0)
MCHC: 35.8 g/dL (ref 30.0–36.0)
MCV: 91.4 fL (ref 78.0–100.0)
Platelets: 300 10*3/uL (ref 150–400)

## 2011-07-06 MED ORDER — DIPHENHYDRAMINE HCL 25 MG PO CAPS
25.0000 mg | ORAL_CAPSULE | Freq: Once | ORAL | Status: AC
  Administered 2011-07-06: 25 mg via ORAL
  Filled 2011-07-06: qty 1

## 2011-07-06 MED ORDER — HYDROMORPHONE HCL PF 2 MG/ML IJ SOLN
2.0000 mg | Freq: Once | INTRAMUSCULAR | Status: AC
  Administered 2011-07-06: 2 mg via INTRAVENOUS
  Filled 2011-07-06: qty 1

## 2011-07-06 MED ORDER — MORPHINE SULFATE 4 MG/ML IJ SOLN: 4.0000 mg | Freq: Once | INTRAMUSCULAR | Status: DC

## 2011-07-06 MED ORDER — SODIUM CHLORIDE 0.9 % IV BOLUS (SEPSIS)
1000.0000 mL | Freq: Once | INTRAVENOUS | Status: AC
  Administered 2011-07-06: 1000 mL via INTRAVENOUS

## 2011-07-06 MED ORDER — ONDANSETRON HCL 4 MG/2ML IJ SOLN
4.0000 mg | Freq: Once | INTRAMUSCULAR | Status: AC
  Administered 2011-07-06: 4 mg via INTRAVENOUS
  Filled 2011-07-06: qty 2

## 2011-07-06 MED ORDER — IPRATROPIUM BROMIDE 0.02 % IN SOLN: 0.5000 mg | Freq: Once | RESPIRATORY_TRACT | Status: DC

## 2011-07-06 MED ORDER — ALBUTEROL SULFATE (5 MG/ML) 0.5% IN NEBU: 5.0000 mg | INHALATION_SOLUTION | Freq: Once | RESPIRATORY_TRACT | Status: DC

## 2011-07-06 MED ORDER — DIPHENHYDRAMINE HCL 50 MG/ML IJ SOLN
25.0000 mg | Freq: Once | INTRAMUSCULAR | Status: AC
Start: 2011-07-06 — End: 2011-07-06
  Administered 2011-07-06: 25 mg via INTRAVENOUS
  Filled 2011-07-06: qty 1

## 2011-07-06 NOTE — ED Notes (Signed)
OZH:[id number]<HQ> Expected date:<BR> Expected time:<BR> Means of arrival:<BR> Comments:<BR> EMS/sickle cell crisis

## 2011-07-06 NOTE — ED Provider Notes (Signed)
History     CSN: [id number]  Arrival date & time 07/06/11  0041   First MD Initiated Contact with Patient 07/06/11 0055      Chief Complaint  Patient presents with  . Sickle Cell Pain Crisis    (Consider location/radiation/quality/duration/timing/severity/associated sxs/prior treatment) HPI Comments: Patient reports typical sickle cell pain that began tonight, beginning with pain in her bilateral knees and elbows, and now with low back pain, chest pain and SOB.  Also notes her mouth is dry.  Took one oxycodone without relief.  Denies fevers, sore throat, cough, abdominal pain, N/V/D, urinary symptoms.  LMP last week was on time and normal.  Pt does have a hx of acute chest syndrome.  Also has a hx of Lupus.    Patient is a 21 y.o. female presenting with sickle cell pain. The history is provided by the patient.  Sickle Cell Pain Crisis  Associated symptoms include chest pain. Pertinent negatives include no abdominal pain, no diarrhea, no vomiting, no dysuria, no vaginal bleeding, no vaginal discharge, no sore throat, no weakness and no cough.    Past Medical History  Diagnosis Date  . Sickle cell anemia   . 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 Lupus     History reviewed. No pertinent past surgical history.  History reviewed. No pertinent family history.  History  Substance Use Topics  . Smoking status: Never Smoker   . Smokeless tobacco: Not on file  . Alcohol Use: No    OB History    Grav Para Term Preterm Abortions TAB SAB Ect Mult Living                  Review of Systems  Constitutional: Negative for fever, activity change and appetite change.  HENT: Negative for sore throat.   Respiratory: Positive for shortness of breath. Negative for cough.   Cardiovascular: Positive for chest pain.  Gastrointestinal: Negative for vomiting, abdominal pain and diarrhea.  Genitourinary: Negative for dysuria, urgency, frequency, vaginal bleeding, vaginal discharge and  menstrual problem.  Neurological: Negative for syncope, weakness and numbness.  All other systems reviewed and are negative.    Allergies  Ibuprofen  Home Medications   Current Outpatient Rx  Name Route Sig Dispense Refill  . ALENDRONATE SODIUM 70 MG PO TABS Oral Take 70 mg by mouth Once a week.    Marland Kitchen FOLIC ACID 1 MG PO TABS Oral Take 1 mg by mouth Daily.    Marland Kitchen HYDROXYCHLOROQUINE SULFATE 200 MG PO TABS Oral Take 200 mg by mouth Twice daily.    Marland Kitchen LIDOCAINE VISCOUS 2 % MT SOLN  apply to the affected area(s) when required 100 mL 0  . OXYCODONE HCL 15 MG PO TABS Oral Take 1 tablet (15 mg total) by mouth every 6 (six) hours as needed for pain. 60 tablet 0  . PREDNISONE 10 MG PO TABS Oral Take 10 mg by mouth.    Marland Kitchen VITAMIN D (ERGOCALCIFEROL) 50000 UNITS PO CAPS Oral Take 50,000 Units by mouth. Take 1 pill mon, wed, fri      BP 119/59  Pulse 88  Temp(Src) 98.1 F (36.7 C) (Oral)  Resp 34  SpO2 89%  LMP 07/04/2011  Physical Exam  Nursing note and vitals reviewed. Constitutional: She is oriented to person, place, and time. She appears well-developed and well-nourished.  HENT:  Head: Normocephalic and atraumatic.  Neck: Neck supple.  Cardiovascular: Normal rate and regular rhythm.   Murmur heard. Pulmonary/Chest: Breath sounds normal.  No respiratory distress. She has no wheezes. She has no rales. She exhibits no tenderness.  Abdominal: Soft. Bowel sounds are normal. She exhibits no distension and no mass. There is tenderness. There is no rebound and no guarding.       Mild epigastric tenderness  Musculoskeletal:       Distal pulses intact.   Neurological: She is alert and oriented to person, place, and time.  Psychiatric: She has a normal mood and affect. Her behavior is normal. Judgment and thought content normal.    ED Course  Procedures (including critical care time)  Labs Reviewed  CBC - Abnormal; Notable for the following:    WBC 23.4 (*) REPEATED TO VERIFY   RBC 2.20 (*)     Hemoglobin 7.2 (*) REPEATED TO VERIFY   HCT 20.1 (*)    RDW 22.9 (*)    All other components within normal limits  DIFFERENTIAL - Abnormal; Notable for the following:    Neutro Abs 17.1 (*)    Lymphs Abs 4.2 (*)    Monocytes Absolute 2.1 (*)    All other components within normal limits  COMPREHENSIVE METABOLIC PANEL - Abnormal; Notable for the following:    Glucose, Bld 101 (*)    Creatinine, Ser 0.46 (*)    AST 42 (*)    Total Bilirubin 4.1 (*)    All other components within normal limits  RETICULOCYTES - Abnormal; Notable for the following:    Retic Ct Pct 37.6 (*) RESULTS CONFIRMED BY MANUAL DILUTION   RBC. 2.20 (*)    Retic Count, Manual 827.2 (*)    All other components within normal limits  URINALYSIS, ROUTINE W REFLEX MICROSCOPIC  PREGNANCY, URINE  URINE CULTURE   Dg Chest 2 View  07/06/2011  *RADIOLOGY REPORT*  Clinical Data: Sickle cell crisis; chest pain, upper back pain and shortness of breath.  CHEST - 2 VIEW  Comparison: Chest radiograph performed 11/03/2010  Findings: The lungs are well-aerated and clear.  There is no evidence of focal opacification, pleural effusion or pneumothorax.  The heart is borderline normal in size; the mediastinal contour is within normal limits.  No acute osseous abnormalities are seen. Bilateral metallic nipple piercings are seen.  A clip is noted within the right upper quadrant, reflecting prior cholecystectomy.  IMPRESSION: No acute cardiopulmonary process seen.  Original Report Authenticated By: Tonia Ghent, M.D.   WBC elevated.  Patient is on prednisone.    3:48 AM Patient reports she is feeling much better, declines further pain medications.  States she is still itching.  Reports she is ready to go home.  Denies any current CP or SOB.    1. Sickle cell pain crisis       MDM  Patient presenting with sickle cell pain, relieved after one dose of medication.  Patient's labs show elevated WBC count, patient is on prednisone for  lupus - CXR negative, will send urine culture as patient is not having symptoms but WBC significantly elevated.  Pt with complete pain relief, d/c home to follow up with Dr Myna Hidalgo, who manages her sickle cell disease, to return for any worsening symptoms.  Patient verbalizes understanding and agrees with plan.          Dillard Cannon [place], [place] 07/06/11 [phone]

## 2011-07-06 NOTE — ED Provider Notes (Signed)
Medical screening examination/treatment/procedure(s) were performed by non-physician practitioner and as supervising physician I was immediately available for consultation/collaboration.   Lyanne Co, MD 07/06/11 [phone]

## 2011-07-06 NOTE — Discharge Instructions (Signed)
Read the information below.  Please follow up with your doctor.  If you develop chest pain, shortness of breath, fevers, or uncontrolled pain, please return immediately to the ER.  You may return to the ER at any time for worsening condition or any new symptoms that concern you.  Sickle Cell Pain Crisis Sickle cell anemia requires regular medical attention by your healthcare provider and awareness about when to seek medical care. Pain is a common problem in children with sickle cell disease. This usually starts at less than 1 year of age. Pain can occur nearly anywhere in the body but most commonly occurs in the extremities, back, chest, or belly (abdomen). Pain episodes can start suddenly or may follow an illness. These attacks can appear as decreased activity, loss of appetite, change in behavior, or simply complaints of pain. DIAGNOSIS   Specialized blood and gene testing can help make this diagnosis early in the disease. Blood tests may then be done to watch blood levels.   Specialized brain scans are done when there are problems in the brain during a crisis.   Lung testing may be done later in the disease.  HOME CARE INSTRUCTIONS   Maintain good hydration. Increase you or your child's fluid intake in hot weather and during exercise.   Avoid smoking. Smoking lowers the oxygen in the blood and can cause the production of sickle-shaped cells (sickling).   Control pain. Only take over-the-counter or prescription medicines for pain, discomfort, or fever as directed by your caregiver. Do not give aspirin to children because of the association with Reye's syndrome.   Keep regular health care checks to keep a proper red blood cell (hemoglobin) level. A moderate anemia level protects against sickling crises.   You and your child should receive all the same immunizations and care as the people around you.   Mothers should breastfeed their babies if possible. Use formulas with iron added if  breastfeeding is not possible. Additional iron should not be given unless there is a lack of it. People with sickle cell disease (SCD) build up iron faster than normal. Give folic acid and additional vitamins as directed.   If you or your child has been prescribed antibiotics or other medications to prevent problems, take them as directed.   Summer camps are available for children with SCD. They may help young people deal with their disease. The camps introduce them to other children with the same problem.   Young people with SCD may become frustrated or angry at their disease. This can cause rebellion and refusal to follow medical care. Help groups or counseling may help with these problems.   Wear a medical alert bracelet. When traveling, keep medical information, caregiver's names, and the medications you or your child takes with you at all times.  SEEK IMMEDIATE MEDICAL CARE IF:   You or your child develops dizziness or fainting, numbness in or difficulty with movement of arms and legs, difficulty with speech, or is acting abnormally. This could be early signs of a stroke. Immediate treatment is necessary.   You or your child has an oral temperature above 102 F (38.9 C), not controlled by medicine.   You or your child has other signs of infection (chills, lethargy, irritability, poor eating, vomiting). The younger the child, the more you should be concerned.   With fevers, do not give medicine to lower the fever right away. This could cover up a problem that is developing. Notify your caregiver.   You  or your child develops pain that is not helped with medicine.   You or your child develops shortness of breath or is coughing up pus-like or bloody sputum.   You or your child develops any problems that are new and are causing you to worry.   You or your child develops a persistent, often uncomfortable and painful penile erection. This is called priapism. Always check young boys for this.  It is often embarrassing for them and they may not bring it to your attention. This is a medical emergency and needs immediate treatment. If this is not treated it will lead to impotence.   You or your child develops a new onset of abdominal pain, especially on the left side near the stomach area.   You or your child has any questions or has problems that are not getting better. Return immediately if you feel your child is getting worse, even if your child was seen only a short while ago.  Document Released: 12/15/2004 Document Revised: 02/24/2011 Document Reviewed: 05/06/2009 [facility] Patient Information 2012 [place], [place].

## 2011-07-06 NOTE — ED Notes (Signed)
Patient stat ed that she is unable to give urine sample at this time. 

## 2011-07-06 NOTE — ED Notes (Signed)
Pt. Discharged to home with mother via w/c, NAD noted

## 2011-07-06 NOTE — ED Notes (Signed)
Pt arrives by EMS with c/o's sickle cell crisis-states generalized pain-took oxycodone 1 hour ago with no relief-states last crisis was in Dec

## 2011-07-07 LAB — URINE CULTURE

## 2011-10-17 ENCOUNTER — Ambulatory Visit (HOSPITAL_BASED_OUTPATIENT_CLINIC_OR_DEPARTMENT_OTHER): Payer: Medicaid Other | Admitting: Hematology & Oncology

## 2011-10-17 ENCOUNTER — Ambulatory Visit (HOSPITAL_BASED_OUTPATIENT_CLINIC_OR_DEPARTMENT_OTHER): Payer: Medicaid Other | Admitting: Lab

## 2011-10-17 DIAGNOSIS — D571 Sickle-cell disease without crisis: Secondary | ICD-10-CM

## 2011-10-17 LAB — CBC WITH DIFFERENTIAL (CANCER CENTER ONLY)
BASO%: 0.3 % (ref 0.0–2.0)
HCT: UNDETERMINED % (ref 34.8–46.6)
LYMPH%: 35.7 % (ref 14.0–48.0)
MCH: UNDETERMINED pg (ref 26.0–34.0)
MCHC: UNDETERMINED g/dL (ref 32.0–36.0)
MCV: UNDETERMINED fL (ref 81–101)
MONO#: 0.8 10*3/uL (ref 0.1–0.9)
MONO%: 7.6 % (ref 0.0–13.0)
NEUT%: 55.7 % (ref 39.6–80.0)
Platelets: 391 10*3/uL (ref 145–400)
RDW: UNDETERMINED % (ref 11.1–15.7)
WBC: 10.9 10*3/uL — ABNORMAL HIGH (ref 3.9–10.0)

## 2011-10-17 LAB — TECHNOLOGIST REVIEW CHCC SATELLITE: Tech Review: 2

## 2011-10-17 MED ORDER — HYDROMORPHONE HCL 4 MG PO TABS: 4.0000 mg | ORAL_TABLET | ORAL | Status: AC | PRN

## 2011-10-19 LAB — HEMOGLOBINOPATHY EVALUATION
Hemoglobin Other: 0 %
Hgb A: 0 % — ABNORMAL LOW (ref 96.8–97.8)
Hgb S Quant: 90.6 % — ABNORMAL HIGH

## 2011-10-19 LAB — RETICULOCYTES (CHCC): Retic Ct Pct: 35.6 % — ABNORMAL HIGH (ref 0.4–2.3)

## 2011-10-24 NOTE — Progress Notes (Signed)
This office note has been dictated.

## 2011-10-24 NOTE — Progress Notes (Signed)
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  CURRENT THERAPY: 1. Folic acid 1 mg p.o. daily. 2. Plaquenil 20 mg p.o. b.i.d. 3. Prednisone 10 mg p.o. daily.  INTERIM HISTORY:  [name] comes in for followup.  I initially saw her back in January.  She has been doing okay.  She has had some crises. She had been on oxycodone.  She says this really has not been working too well lately.  As such, we will switch her over to Dilaudid (4 mg p.o. q.4 hours p.r.n.) to see if this does not help with her sickle issues.  She is going to be going to [place] for a study abroad program.  She will be going in early September.  Because of this, she wanted to be seen.  She, again, has had some crises.  It is hard to say how much is actual sickle cell and what might be partially related to her lupus.  When we last saw her, we did a hemoglobin electrophoresis on her.  This showed 27% hemoglobin A and 63% hemoglobin S.  She has not had any actual exchange transfusions.  She has had no cough.  There is no increased shortness of breath.  There are no problems with bowels or bladder.  She has had no fever.  She has not noticed any skin changes.  PHYSICAL EXAMINATION:  General:  This is a thin but well-nourished [name] in no obvious distress.  Vital signs: Temperature of 97.7, pulse 96, respiratory rate 18, blood pressure 102/58.  Weight is 150.  Head and neck:  Normocephalic, atraumatic skull.  There are no ocular or oral lesions.  There are no palpable cervical or supraclavicular lymph nodes.  Lungs:  Clear bilaterally. Cardiac:  Regular rate and rhythm with a normal S1, S2.  There are no murmurs, rubs or bruits.  Abdomen:  Soft with good bowel sounds.  There is no palpable abdominal mass.  There is no palpable hepatosplenomegaly. Extremities:  Shows no clubbing, cyanosis or edema.  She has good range of motion of her joints.  Skin:  Shows no rash, ecchymosis or  petechia. Neurological:  Shows no focal neurological deficits.  LABORATORY STUDIES:  White cell count is 10.9, hemoglobin 6.8 and platelet count 391.  Retic count is 36%.  IMPRESSION:  [name] is a 21 year old African American Ellis with hemoglobin SS disease.  She clearly is hemolyzing quite a bit right now. She is on folic acid.  I told her to double the folic acid dose.  I am truly worried about her going over to [place] and having a crisis over there.  I am just not confident as to how well she will be cared for.  I think the best thing we could do is to try to be "proactive" and try to phlebotomize her and do an exchange before she goes over to [place]. She will be over 4 months.  I think we really need to do a complete exchange on her that would give her the benefit of regular hemoglobin and decrease the chance of crises.  She is worried about having this done.  I tried to reassure her as much as I could.  I believe that a full and formal exchange would be helpful.  I will see about getting this set up.  I think we can do this in town now.  If, for some reason, we cannot do a formal change in the hospital, then we will have to  do a "mini" exchange in the office.  I probably would do for several days and hopefully we will be able to see her hemoglobin improving.  She really is asymptomatic with this hemoglobin.  We will see about getting her set up for a formal exchange transfusion.  Again, she will be gone for 4 months.  She will let us know when she is back so we can see her in the office.    ______________________________ [name], M.D. PRE/MEDQ  D:  10/24/2011  T:  10/24/2011  Job:  [id number]

## 2012-05-05 ENCOUNTER — Inpatient Hospital Stay (HOSPITAL_COMMUNITY)
Admission: EM | Admit: 2012-05-05 | Discharge: 2012-05-07 | DRG: 812 | Disposition: A | Discharge status: Home / Self Care | Payer: Medicaid Other | Attending: Internal Medicine | Admitting: Internal Medicine

## 2012-05-05 ENCOUNTER — Encounter (HOSPITAL_COMMUNITY): Payer: Self-pay | Admitting: Emergency Medicine

## 2012-05-05 DIAGNOSIS — Z9089 Acquired absence of other organs: Secondary | ICD-10-CM

## 2012-05-05 DIAGNOSIS — E860 Dehydration: Secondary | ICD-10-CM | POA: Diagnosis present

## 2012-05-05 DIAGNOSIS — R079 Chest pain, unspecified: Secondary | ICD-10-CM

## 2012-05-05 DIAGNOSIS — D649 Anemia, unspecified: Secondary | ICD-10-CM | POA: Diagnosis present

## 2012-05-05 DIAGNOSIS — M329 Systemic lupus erythematosus, unspecified: Secondary | ICD-10-CM | POA: Diagnosis present

## 2012-05-05 DIAGNOSIS — D5700 Hb-SS disease with crisis, unspecified: Principal | ICD-10-CM | POA: Diagnosis present

## 2012-05-05 DIAGNOSIS — Z79899 Other long term (current) drug therapy: Secondary | ICD-10-CM

## 2012-05-05 DIAGNOSIS — Z886 Allergy status to analgesic agent status: Secondary | ICD-10-CM

## 2012-05-05 HISTORY — DX: Cardiac murmur, unspecified: R01.1

## 2012-05-05 NOTE — ED Notes (Signed)
Pt alert, arrives from college, c/o pain associated with sickle cell, onset was today, resp even unlabored, skin pwd, pt states home remedies w/o relief

## 2012-05-06 ENCOUNTER — Encounter (HOSPITAL_COMMUNITY): Payer: Self-pay | Admitting: Internal Medicine

## 2012-05-06 ENCOUNTER — Emergency Department (HOSPITAL_COMMUNITY): Payer: Medicaid Other

## 2012-05-06 DIAGNOSIS — D5700 Hb-SS disease with crisis, unspecified: Secondary | ICD-10-CM | POA: Diagnosis present

## 2012-05-06 DIAGNOSIS — D649 Anemia, unspecified: Secondary | ICD-10-CM | POA: Diagnosis present

## 2012-05-06 DIAGNOSIS — M329 Systemic lupus erythematosus, unspecified: Secondary | ICD-10-CM | POA: Diagnosis present

## 2012-05-06 DIAGNOSIS — D571 Sickle-cell disease without crisis: Secondary | ICD-10-CM

## 2012-05-06 HISTORY — DX: Sickle-cell disease without crisis: D57.1

## 2012-05-06 LAB — URINALYSIS, ROUTINE W REFLEX MICROSCOPIC
Ketones, ur: NEGATIVE mg/dL
Nitrite: NEGATIVE
pH: 5.5 (ref 5.0–8.0)

## 2012-05-06 LAB — BASIC METABOLIC PANEL
CO2: 22 mEq/L (ref 19–32)
Chloride: 100 mEq/L (ref 96–112)
Potassium: 4.1 mEq/L (ref 3.5–5.1)
Sodium: 134 mEq/L — ABNORMAL LOW (ref 135–145)

## 2012-05-06 LAB — CBC
HCT: 14.5 % — ABNORMAL LOW (ref 36.0–46.0)
Hemoglobin: 5.6 g/dL — CL (ref 12.0–15.0)
MCH: 34.8 pg — ABNORMAL HIGH (ref 26.0–34.0)
MCHC: 38.6 g/dL — ABNORMAL HIGH (ref 30.0–36.0)
MCV: 90.1 fL (ref 78.0–100.0)
Platelets: 192 10*3/uL (ref 150–400)
RBC: 1.61 MIL/uL — ABNORMAL LOW (ref 3.87–5.11)
RDW: 24.2 % — ABNORMAL HIGH (ref 11.5–15.5)
WBC: 19.1 10*3/uL — ABNORMAL HIGH (ref 4.0–10.5)

## 2012-05-06 LAB — DIFFERENTIAL
Basophils Absolute: 0 10*3/uL (ref 0.0–0.1)
Basophils Relative: 0 % (ref 0–1)
Eosinophils Absolute: 0.2 10*3/uL (ref 0.0–0.7)
Eosinophils Relative: 1 % (ref 0–5)
Lymphocytes Relative: 27 % (ref 12–46)
Lymphs Abs: 5.2 10*3/uL — ABNORMAL HIGH (ref 0.7–4.0)
Monocytes Absolute: 1.5 10*3/uL — ABNORMAL HIGH (ref 0.1–1.0)
Monocytes Relative: 8 % (ref 3–12)
Neutro Abs: 12.2 10*3/uL — ABNORMAL HIGH (ref 1.7–7.7)
Neutrophils Relative %: 64 % (ref 43–77)

## 2012-05-06 LAB — TROPONIN I: Troponin I: <0.3 ng/mL (ref <0.30)

## 2012-05-06 LAB — URINE MICROSCOPIC-ADD ON

## 2012-05-06 LAB — RETICULOCYTES
RBC.: 1.61 MIL/uL — ABNORMAL LOW (ref 3.87–5.11)
Retic Count, Absolute: 621.5 10*3/uL — ABNORMAL HIGH (ref 19.0–186.0)
Retic Ct Pct: 38.6 % — ABNORMAL HIGH (ref 0.4–3.1)

## 2012-05-06 LAB — PREPARE RBC (CROSSMATCH)

## 2012-05-06 MED ORDER — HYDROMORPHONE HCL PF 1 MG/ML IJ SOLN: 0.5000 mg | INTRAMUSCULAR | Status: DC | PRN

## 2012-05-06 MED ORDER — ALUM & MAG HYDROXIDE-SIMETH 200-200-20 MG/5ML PO SUSP: 30.0000 mL | Freq: Four times a day (QID) | ORAL | Status: DC | PRN

## 2012-05-06 MED ORDER — MORPHINE SULFATE 4 MG/ML IJ SOLN
4.0000 mg | Freq: Once | INTRAMUSCULAR | Status: DC
  Administered 2012-05-06: 4 mg via INTRAVENOUS

## 2012-05-06 MED ORDER — ONDANSETRON HCL 4 MG/2ML IJ SOLN: 4.0000 mg | Freq: Once | INTRAMUSCULAR | Status: DC

## 2012-05-06 MED ORDER — MORPHINE SULFATE 4 MG/ML IJ SOLN
4.0000 mg | Freq: Once | INTRAMUSCULAR | Status: DC
  Filled 2012-05-06: qty 1

## 2012-05-06 MED ORDER — ONDANSETRON HCL 4 MG/2ML IJ SOLN: 4.0000 mg | Freq: Four times a day (QID) | INTRAMUSCULAR | Status: DC | PRN

## 2012-05-06 MED ORDER — MORPHINE SULFATE 4 MG/ML IJ SOLN
4.0000 mg | Freq: Once | INTRAMUSCULAR | Status: AC
  Administered 2012-05-06: 4 mg via INTRAVENOUS
  Filled 2012-05-06: qty 1

## 2012-05-06 MED ORDER — DIPHENHYDRAMINE HCL 50 MG/ML IJ SOLN
25.0000 mg | Freq: Four times a day (QID) | INTRAMUSCULAR | Status: DC | PRN
  Administered 2012-05-06: 25 mg via INTRAVENOUS
  Administered 2012-05-06: 06:00:00 via INTRAVENOUS
  Filled 2012-05-06 (×2): qty 1

## 2012-05-06 MED ORDER — OXYCODONE HCL 5 MG PO TABS
5.0000 mg | ORAL_TABLET | ORAL | Status: DC | PRN
  Administered 2012-05-06: 5 mg via ORAL
  Filled 2012-05-06 (×2): qty 1

## 2012-05-06 MED ORDER — MORPHINE SULFATE 4 MG/ML IJ SOLN: 4.0000 mg | Freq: Once | INTRAMUSCULAR | Status: DC

## 2012-05-06 MED ORDER — ONDANSETRON 4 MG PO TBDP
4.0000 mg | ORAL_TABLET | Freq: Once | ORAL | Status: AC
  Administered 2012-05-06: 4 mg via ORAL
  Filled 2012-05-06: qty 1

## 2012-05-06 MED ORDER — ENOXAPARIN SODIUM 40 MG/0.4ML ~~LOC~~ SOLN
40.0000 mg | SUBCUTANEOUS | Status: DC
  Administered 2012-05-07: 40 mg via SUBCUTANEOUS
  Filled 2012-05-06 (×2): qty 0.4

## 2012-05-06 MED ORDER — SODIUM CHLORIDE 0.9 % IV SOLN
INTRAVENOUS | Status: DC
  Administered 2012-05-06 – 2012-05-07 (×3): via INTRAVENOUS

## 2012-05-06 MED ORDER — SODIUM CHLORIDE 0.9 % IV BOLUS (SEPSIS)
1000.0000 mL | Freq: Once | INTRAVENOUS | Status: AC
  Administered 2012-05-06: 1000 mL via INTRAVENOUS

## 2012-05-06 MED ORDER — ZOLPIDEM TARTRATE 5 MG PO TABS: 5.0000 mg | ORAL_TABLET | Freq: Every evening | ORAL | Status: DC | PRN

## 2012-05-06 MED ORDER — ACETAMINOPHEN 325 MG PO TABS
650.0000 mg | ORAL_TABLET | Freq: Four times a day (QID) | ORAL | Status: DC | PRN
  Administered 2012-05-06 (×3): 650 mg via ORAL
  Filled 2012-05-06 (×3): qty 2

## 2012-05-06 MED ORDER — HYDROMORPHONE HCL PF 1 MG/ML IJ SOLN: 1.0000 mg | INTRAMUSCULAR | Status: DC | PRN

## 2012-05-06 MED ORDER — ACETAMINOPHEN 650 MG RE SUPP: 650.0000 mg | Freq: Four times a day (QID) | RECTAL | Status: DC | PRN

## 2012-05-06 MED ORDER — SODIUM CHLORIDE 0.9 % IV SOLN
INTRAVENOUS | Status: DC
  Administered 2012-05-06: 06:00:00 via INTRAVENOUS

## 2012-05-06 MED ORDER — MORPHINE SULFATE 4 MG/ML IJ SOLN: 6.0000 mg | Freq: Once | INTRAMUSCULAR | Status: DC

## 2012-05-06 MED ORDER — ONDANSETRON HCL 4 MG PO TABS: 4.0000 mg | ORAL_TABLET | Freq: Four times a day (QID) | ORAL | Status: DC | PRN

## 2012-05-06 NOTE — ED Notes (Signed)
Tess RN unable to take report at this time.

## 2012-05-06 NOTE — H&P (Signed)
[facility] History and Physical  [name] ZOX:[id number] DOB: 1990-06-30 DOA: 05/05/2012  Referring physician: EDP PCP: Lemont Fillers., NP  Specialists:   Chief Complaint: Pain in chest and Back and in legs and elbow  HPI: [name] is a 22 y.o. female with a history of Sickle Cell disease and Systemic Lupus Erythematosus who presents to the ED with complaints of 10/10 sharp pain in her back chest and all of her extremities. The pain began at 11 pm, and was unremitting with her usual pain medicine regimen.  She reports having chills but no fevers.  She was found to have a hemoglobin level lower than her usual at 5.6.  And her reticulocyte count was elevated and she was referred for admission.       Review of Systems: The patient denies anorexia, fever, weight loss, vision loss, decreased hearing, hoarseness, chest pain, syncope, dyspnea on exertion, peripheral edema, balance deficits, hemoptysis, abdominal pain,nausea vomiting, diarrhea, hematemesis, melena, hematochezia, severe indigestion/heartburn, dysuria,  hematuria, incontinence, muscle weakness, suspicious skin lesions, transient blindness, difficulty walking, depression, unusual weight change, abnormal bleeding, enlarged lymph nodes, angioedema, and breast masses.    Past Medical History  Diagnosis Date  . Sickle cell anemia   . 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 Lupus    Past Surgical History  Procedure Laterality Date  . Cholecystectomy       Medications:  HOME MEDS: Prior to Admission medications   Medication Sig Start Date End Date Taking? Authorizing Provider  folic acid (FOLVITE) 1 MG tablet Take 1 mg by mouth Daily. 03/16/11  Yes Historical Provider, MD  levonorgestrel-ethinyl estradiol (ORSYTHIA) 0.1-20 MG-MCG tablet Take 1 tablet by mouth daily.   Yes Historical Provider, MD  valACYclovir (VALTREX) 500 MG tablet Take 500 mg by mouth 2 (two) times daily as needed. For outbreak   Yes  Historical Provider, MD    Allergies:  Allergies  Allergen Reactions  . Ibuprofen Other (See Comments)    Due to other medications she takes for Lupus    Social History:   reports that she has never smoked. She does not have any smokeless tobacco history on file. She reports that she does not drink alcohol or use illicit drugs.  Family History: Family History  Problem Relation Age of Onset  . Sickle cell trait Mother   . Sickle cell trait Father   . Breast cancer Maternal Aunt     X 2  . Cancer Maternal Grandfather     Pancreatic Cancer  . Diabetes Maternal Aunt      Physical Exam:  GEN:  Pleasant 22 year old thin well developed African American Female examined  and in no acute distress; cooperative with exam Filed Vitals:   05/05/12 2352 05/06/12 0009  BP: 128/57 130/67  Pulse: 90 94  Temp: 98 F (36.7 C)   TempSrc: Oral   Resp: 16 22  SpO2: 93% 93%   Blood pressure 130/67, pulse 94, temperature 98 F (36.7 C), temperature source Oral, resp. rate 22, last menstrual period 04/28/2012, SpO2 93.00%. PSYCH: She is alert and oriented x4; does not appear anxious does not appear depressed; affect is normal HEENT: Normocephalic and Atraumatic, Mucous membranes pink; PERRLA; EOM intact; Fundi:  Benign;   +SCERAL ICTERUS, Nares: Patent, Oropharynx: Clear,  Fair Dentition, Neck:  FROM, no cervical lymphadenopathy nor thyromegaly or carotid bruit; no JVD; Breasts:: Not examined CHEST WALL: No tenderness CHEST: Normal respiration, clear to auscultation bilaterally HEART: Regular rate and rhythm;  no murmurs rubs or gallops BACK: No kyphosis or scoliosis; no CVA tenderness ABDOMEN: Positive Bowel Sounds, Scaphoid, soft non-tender; no masses, no organomegaly RECTAL Exam: Not done EXTREMITIES: No bone or joint deformity; age-appropriate arthropathy of the hands and knees; no cyanosis, clubbing or edema; no ulcerations. Genitalia: not examined PULSES: 2+ and symmetric SKIN: Normal  hydration no rash or ulceration CNS: Cranial nerves 2-12 grossly intact no focal neurologic deficit    Labs on Admission:  Basic Metabolic Panel:  Recent Labs Lab 05/06/12 0225  NA 134*  K 4.1  CL 100  CO2 22  GLUCOSE 85  BUN 6  CREATININE 0.49*  CALCIUM 8.7   Liver Function Tests: No results found for this basename: AST, ALT, ALKPHOS, BILITOT, PROT, ALBUMIN,  in the last 168 hours No results found for this basename: LIPASE, AMYLASE,  in the last 168 hours No results found for this basename: AMMONIA,  in the last 168 hours CBC:  Recent Labs Lab 05/06/12 0355  WBC 19.1*  NEUTROABS 12.2*  HGB 5.6*  HCT 14.5*  MCV 90.1  PLT 192   Cardiac Enzymes: No results found for this basename: CKTOTAL, CKMB, CKMBINDEX, TROPONINI,  in the last 168 hours  BNP (last 3 results) No results found for this basename: PROBNP,  in the last 8760 hours CBG: No results found for this basename: GLUCAP,  in the last 168 hours  Radiological Exams on Admission: Dg Chest 2 View  05/06/2012  *RADIOLOGY REPORT*  Clinical Data: Sickle cell crisis  CHEST - 2 VIEW  Comparison: 07/06/2011  Findings: Cardiac enlargement noted.  No pleural effusion identified.  There is pulmonary vascular congestion.  No airspace consolidation or atelectasis.  Visualized osseous structures appear intact.  There are cholecystectomy clips within the right upper quadrant of the abdomen.  IMPRESSION:  1.  Cardiac enlargement and pulmonary venous congestion.   Original Report Authenticated By: Signa Kell, M.D.     EKG:   Assessment/Plan Principal Problem:   Sickle cell pain crisis Active Problems:   Anemia   Systemic lupus erythematosus    Chest Pain    Dehydration    1.   Sickle Cell Pain Crisis-   Pain Control with V dilaudid 1-2 mg every 2 hours PRN.  IVFs for rehydration.     2.   Anemia-   Hemodynamically stable no emergent need for transfusion, patient would like more time to consider whether to have a  transfusion.  She reports a histtoy of adverse events when he has had transfusions in the past such as Fevers.    3.   Systemic Lupus Erythematosus-   May also be in a lupus pain flare as well.  Pain control PRN .     4.   Chest Pain-   Check troponins,   5..   Dehydration-  Clinically dry, IVFs for rehydration.     6.,  Other- reconcile home medications.      Code Status:    FULL CODE Family Communication:  No Family at Bedside.   Disposition Plan:     Return to Home on discharge.    Time spent: 60 Minutes  Ron Parker [facility] Pager [phone]  If 7PM-7AM, please contact night-coverage [email or web address] Password [facility] 05/06/2012, 5:37 AM

## 2012-05-06 NOTE — ED Notes (Signed)
IV RN at bedside 

## 2012-05-06 NOTE — Progress Notes (Signed)
[facility] PROGRESS NOTE  [name] ZOX:[id number] DOB: [birth year] DOA: 05/05/2012 PCP: Lemont Fillers., NP  Assessment/Plan: Principal Problem: Sickle cell pain crisis Continue pain management for sickle cell crisis. Continue IV fluids. Patient feels much better today.  Active Problems: Anemia Will transfuse her one unit. Patient states that she had a problem with hemolysis while hospitalized in [place] last year. Will premedicate with Benadryl and Tylenol and transfuse one unit.  Systemic lupus erythematosus Patient is not on any steroids. Lupus is stable.   Code Status: Full Code Family Communication: None Disposition Plan: 2-3 days  Molli Posey, MD  Pager [phone].  If 7PM-7AM, please contact night-coverage at [email or web address], password [facility] 05/06/2012, 3:14 PM   LOS: 1 day   Brief narrative: [name] is a 22 y.o. female with a history of Sickle Cell disease and Systemic Lupus Erythematosus. She came in with complaints of 10/10 sharp pain in her back chest and all of her extremities that was to her usual pain medicine regimen.  She was found to have a hemoglobin level of 5.6 and her reticulocyte count was elevated and she was referred for admission.    Consultants:  none  Procedures:  nonoe  Antibiotics:  none ?  HPI/Subjective: Patient feel much better. Pain is improved.  Objective: Filed Vitals:   05/06/12 1135 05/06/12 1245 05/06/12 1320 05/06/12 1420  BP: 107/53 100/57 116/55 106/49  Pulse: 80 80 86 89  Temp: 98.8 F (37.1 C) 98.8 F (37.1 C) 98.2 F (36.8 C) 98.5 F (36.9 C)  TempSrc: Oral Oral Oral Oral  Resp: 18 18 20 20   Height: 5\' 6"  (1.676 m)     Weight: 73 kg (160 lb 15 oz)     SpO2: 91%       Intake/Output Summary (Last 24 hours) at 05/06/12 1514 Last data filed at 05/06/12 1305  Gross per 24 hour  Intake    500 ml  Output      0 ml  Net    500 ml    Exam:   General: Patient sitting up in bed. She does not  seem to be in any acute distress.  Cardiovascular: Regular rate rhythm, 2/6 systolic murmur  Respiratory: Clear to auscultation bilaterally. No wheezes rhonchi or rales   Abdomen: Soft, nontender positive bowel sounds  Extremities: No edema  Data Reviewed: Basic Metabolic Panel:  Recent Labs Lab 05/06/12 0225  NA 134*  K 4.1  CL 100  CO2 22  GLUCOSE 85  BUN 6  CREATININE 0.49*  CALCIUM 8.7    Recent Labs Lab 05/06/12 0355  WBC 19.1*  NEUTROABS 12.2*  HGB 5.6*  HCT 14.5*  MCV 90.1  PLT 192   Cardiac Enzymes:  Recent Labs Lab 05/06/12 0642 05/06/12 1153  TROPONINI <0.30 <0.30    Studies: Dg Chest 2 View  05/06/2012  *RADIOLOGY REPORT*  Clinical Data: Sickle cell crisis  CHEST - 2 VIEW  Comparison: 07/06/2011  Findings: Cardiac enlargement noted.  No pleural effusion identified.  There is pulmonary vascular congestion.  No airspace consolidation or atelectasis.  Visualized osseous structures appear intact.  There are cholecystectomy clips within the right upper quadrant of the abdomen.  IMPRESSION:  1.  Cardiac enlargement and pulmonary venous congestion.   Original Report Authenticated By: Signa Kell, M.D.     Scheduled Meds: . enoxaparin (LOVENOX) injection  40 mg Subcutaneous Q24H  .  morphine injection  4 mg Intramuscular Once   Continuous Infusions: . sodium chloride 100 mL/hr  at 05/06/12 1019     Time Spent:25 min

## 2012-05-06 NOTE — ED Notes (Signed)
Unable to gain IV access for pt care. IV RN paged for start.

## 2012-05-06 NOTE — ED Notes (Signed)
Patient has wanted to hold off on transfusion of blood until her mother arrived. States that she normally has a transfusion reaction with increased temp, itchiness and sweating. Patient states that with last reaction she had hemolysis. MD Earlene Plater called to make aware and to order premedications. MD Earlene Plater wants to hold off until further investigation into her hx.

## 2012-05-06 NOTE — ED Provider Notes (Addendum)
Medical screening examination/treatment/procedure(s) were performed by non-physician practitioner and as supervising physician I was immediately available for consultation/collaboration.  Olivia Mackie, MD 05/06/12 0534   Date: 05/06/2012  Rate:95  Rhythm: normal sinus rhythm  QRS Axis: normal  Intervals: normal  ST/T Wave abnormalities: normal  Conduction Disutrbances:none  Narrative Interpretation: LVH  Old EKG Reviewed: none available    Olivia Mackie, MD 05/06/12 [phone]

## 2012-05-06 NOTE — ED Notes (Signed)
Patient sleeping with friends/family in room.VS stable with 2L [place].

## 2012-05-06 NOTE — ED Notes (Signed)
Phone call to Lab for blood draw for pt due to numerous unsuccessful sticks by RNs and IV RN. Lab asked to have nurse that drew last lab to restick because all lab techs were not available. RN not available to draw. EMT to attempt restick.

## 2012-05-06 NOTE — ED Provider Notes (Signed)
History     CSN: [id number]  Arrival date & time 05/05/12  2349   First MD Initiated Contact with Patient 05/06/12 0009      Chief Complaint  Patient presents with  . Sickle Cell Pain Crisis    (Consider location/radiation/quality/duration/timing/severity/associated sxs/prior treatment) HPI   PCP: Peggyann Juba  Pt presents to the ED from home with complaints of sickle cell crisis. She says she has them very infrequently. Her last visit to the ED for a crisis was back in August of 2013. She takes Ibuprofen and if that doesn't work will take OxyContin. In the ED she will get Morphine if her pain is really severe. She has a PMH positive for Lupus as well.  Today she is having pain in her chest and back. As well as her extremities intermittently. This is her normal pain pattern. No cough, fever, nausea, vomiting diarrhea or weakness. She tried her pain medications at home and they did not help.  vss  Past Medical History  Diagnosis Date  . Sickle cell anemia   . 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 Lupus     History reviewed. No pertinent past surgical history.  No family history on file.  History  Substance Use Topics  . Smoking status: Never Smoker   . Smokeless tobacco: Not on file  . Alcohol Use: No    OB History   Grav Para Term Preterm Abortions TAB SAB Ect Mult Living                  Review of Systems  Review of Systems  Gen: no weight loss, fevers, chills, night sweats  Eyes: no discharge or drainage, no occular pain or visual changes  Nose: no epistaxis or rhinorrhea  Mouth: no dental pain, no sore throat  Neck: no neck pain  Lungs:No wheezing, coughing or hemoptysis CV: no chest pain, palpitations, dependent edema or orthopnea  Abd: no abdominal pain, nausea, vomiting  GU: no dysuria or gross hematuria  MSK:  + SSC with back, chest and extremity pains Neuro: no headache, no focal neurologic deficits  Skin: no abnormalities Psyche:  negative.   Allergies  Ibuprofen  Home Medications   Current Outpatient Rx  Name  Route  Sig  Dispense  Refill  . folic acid (FOLVITE) 1 MG tablet   Oral   Take 1 mg by mouth Daily.         Marland Kitchen levonorgestrel-ethinyl estradiol (ORSYTHIA) 0.1-20 MG-MCG tablet   Oral   Take 1 tablet by mouth daily.         . valACYclovir (VALTREX) 500 MG tablet   Oral   Take 500 mg by mouth 2 (two) times daily as needed. For outbreak           BP 130/67  Pulse 94  Temp(Src) 98 F (36.7 C) (Oral)  Resp 22  SpO2 93%  LMP 04/28/2012  Physical Exam  Nursing note and vitals reviewed. Constitutional: She appears well-developed and well-nourished. No distress.  Pt appears uncomfortable  HENT:  Head: Normocephalic and atraumatic.  Eyes: Pupils are equal, round, and reactive to light.  Neck: Normal range of motion. Neck supple.  Cardiovascular: Normal rate and regular rhythm.   Pulmonary/Chest: Effort normal.  Abdominal: Soft.  Neurological: She is alert.  Skin: Skin is warm and dry.    ED Course  Procedures (including critical care time)  Labs Reviewed  BASIC METABOLIC PANEL - Abnormal; Notable for the following:  Sodium 134 (*)    Creatinine, Ser 0.49 (*)    All other components within normal limits  CBC - Abnormal; Notable for the following:    RBC 1.61 (*)    Hemoglobin 5.6 (*)    HCT 14.5 (*)    MCH 34.8 (*)    MCHC 38.6 (*)    RDW 24.2 (*)    All other components within normal limits  RETICULOCYTES - Abnormal; Notable for the following:    Retic Ct Pct 38.6 (*)    RBC. 1.61 (*)    Retic Count, Manual 621.5 (*)    All other components within normal limits  DIFFERENTIAL  PREPARE RBC (CROSSMATCH)  TYPE AND SCREEN   Dg Chest 2 View  05/06/2012  *RADIOLOGY REPORT*  Clinical Data: Sickle cell crisis  CHEST - 2 VIEW  Comparison: 07/06/2011  Findings: Cardiac enlargement noted.  No pleural effusion identified.  There is pulmonary vascular congestion.  No airspace  consolidation or atelectasis.  Visualized osseous structures appear intact.  There are cholecystectomy clips within the right upper quadrant of the abdomen.  IMPRESSION:  1.  Cardiac enlargement and pulmonary venous congestion.   Original Report Authenticated By: Signa Kell, M.D.      1. Sickle cell crisis   2. Anemia requiring transfusions       MDM  pts pain under control. Her labs show that she has a retic count of greater than 600 and her hemoglobin is 5.6. Pt need blood transfusion.   Type and Cross ordered. Will Transfuse 1 L of RBCs.  Admit to [facility], obs, team 8, tele        Dorthula Matas, [place] 05/06/12 [phone]

## 2012-05-07 LAB — CBC
HCT: 20.9 % — ABNORMAL LOW (ref 36.0–46.0)
Hemoglobin: 7.8 g/dL — ABNORMAL LOW (ref 12.0–15.0)
RBC: 2.49 MIL/uL — ABNORMAL LOW (ref 3.87–5.11)
WBC: 12.5 10*3/uL — ABNORMAL HIGH (ref 4.0–10.5)

## 2012-05-07 LAB — HEPATIC FUNCTION PANEL
AST: 100 U/L — ABNORMAL HIGH (ref 0–37)
Albumin: 3.4 g/dL — ABNORMAL LOW (ref 3.5–5.2)
Alkaline Phosphatase: 77 U/L (ref 39–117)
Total Bilirubin: 5.1 mg/dL — ABNORMAL HIGH (ref 0.3–1.2)

## 2012-05-07 LAB — BASIC METABOLIC PANEL
BUN: 8 mg/dL (ref 6–23)
Chloride: 103 mEq/L (ref 96–112)
GFR calc Af Amer: >90 mL/min (ref >90)
Potassium: 4.4 mEq/L (ref 3.5–5.1)
Sodium: 136 mEq/L (ref 135–145)

## 2012-05-07 NOTE — Discharge Summary (Signed)
[name] MRN: [id number] DOB/AGE: 1990/03/27 22 y.o.  Admit date: 05/05/2012 Discharge date: 05/07/2012  Primary Care Physician:  No primary provider on file.   Discharge Diagnoses:   Patient Active Problem List  Diagnosis  . Sickle cell disease, type SS  . Sickle cell pain crisis  . Anemia  . Systemic lupus erythematosus    DISCHARGE MEDICATION:   Medication List    TAKE these medications       folic acid 1 MG tablet  Commonly known as:  FOLVITE  Take 1 mg by mouth Daily.     HYDROmorphone 4 MG tablet  Commonly known as:  DILAUDID  Take 4 mg by mouth every 4 (four) hours as needed for pain.     ORSYTHIA 0.1-20 MG-MCG tablet  Generic drug:  levonorgestrel-ethinyl estradiol  Take 1 tablet by mouth daily.     oxyCODONE 15 MG immediate release tablet  Commonly known as:  ROXICODONE  Take 15 mg by mouth every 6 (six) hours as needed for pain.     valACYclovir 500 MG tablet  Commonly known as:  VALTREX  Take 500 mg by mouth 2 (two) times daily as needed. For outbreak          Consults:     SIGNIFICANT DIAGNOSTIC STUDIES:  Dg Chest 2 View  05/06/2012  *RADIOLOGY REPORT*  Clinical Data: Sickle cell crisis  CHEST - 2 VIEW  Comparison: 07/06/2011  Findings: Cardiac enlargement noted.  No pleural effusion identified.  There is pulmonary vascular congestion.  No airspace consolidation or atelectasis.  Visualized osseous structures appear intact.  There are cholecystectomy clips within the right upper quadrant of the abdomen.  IMPRESSION:  1.  Cardiac enlargement and pulmonary venous congestion.   Original Report Authenticated By: Signa Kell, M.D.        No results found for this or any previous visit (from the past 240 hour(s)).  BRIEF ADMITTING H & P: [name] is a 22 y.o. female with a history of Sickle Cell disease and Systemic Lupus Erythematosus who presents to the ED with complaints of 10/10 sharp pain in her back chest and all of her extremities. The  pain began at 11 pm, and was unremitting with her usual pain medicine regimen. She reports having chills but no fevers. She was found to have a hemoglobin level lower than her usual at 5.6. And her reticulocyte count was elevated and she was referred for admission.     Hospital Course:  Present on Admission:  . Sickle cell pain crisis; Pt was admitted with acute VOC. She was treated with IV bolus analgesics and Toradol. She was also given IV hydration and was transfused 1 Unit PRBC's. At the time of evaluation she had pain of 0/10. She was discharged home in stable condition to follow up with her Primary hematologist Dr. Myna Hidalgo with 3-5 days. Discussed with Dr. Myna Hidalgo.  . Anemia: Pt had a Hgb of 5.6 on admission. She was transfused 1 unit of PRBC's and at discharge her Hbg was 7.8.   Disposition and Follow-up:  Pt to follow up with Primary Hematologist in 305 days. Office to call to arrange appointment.      Discharge Orders   Future Orders Complete By Expires     Activity as tolerated - No restrictions  As directed     Diet general  As directed        DISCHARGE EXAM: General: Patient sitting up in bed in no acute distress. Vital Signs  BP107/54, HR 67, T 98.4 F (36.9 C), temperature source Oral, RR 18, height 5\' 6"  (1.676 m), weight 73 kg (160 lb 15 oz), last menstrual period 04/28/2012, SpO2 91.00%. Cardiovascular: Regular rate rhythm, 2/6 systolic murmur  Respiratory: Clear to auscultation bilaterally. No wheezes rhonchi or rales  Abdomen: Soft, nontender positive bowel sounds  Extremities: No edema    Recent Labs  05/06/12 0225 05/07/12 0506  NA 134* 136  K 4.1 4.4  CL 100 103  CO2 22 25  GLUCOSE 85 81  BUN 6 8  CREATININE 0.49* 0.48*  CALCIUM 8.7 8.5    Recent Labs  05/07/12 0506  AST 100*  ALT 57*  ALKPHOS 77  BILITOT 5.1*  PROT 7.0  ALBUMIN 3.4*   No results found for this basename: LIPASE, AMYLASE,  in the last 72 hours  Recent Labs   05/06/12 0355 05/07/12 0506  WBC 19.1* 12.5*  NEUTROABS 12.2*  --   HGB 5.6* 7.8*  HCT 14.5* 20.9*  MCV 90.1 84.1  PLT 192 320   Total time for discharge including decision-making face-to-face time greater than 30 minutes Signed: Avangeline Stockburger A. 05/07/2012, 2:36 PM

## 2012-05-07 NOTE — Care Management Note (Signed)
    Page 1 of 1   05/07/2012     3:13:58 PM   CARE MANAGEMENT NOTE 05/07/2012  Patient:  WANIYA, HOGLUND   Account Number:  [id number]  Date Initiated:  05/07/2012  Documentation initiated by:  Lanier Clam  Subjective/Objective Assessment:   ADMITTED W/SICKLE CELL CRISIS.     Action/Plan:   FROM HOME.HAS PCP,PHARMACY.   Anticipated DC Date:  05/07/2012   Anticipated DC Plan:  HOME/SELF CARE      DC Planning Services  CM consult      Choice offered to / List presented to:             Status of service:  Completed, signed off Medicare Important Message given?   (If response is "NO", the following Medicare IM given date fields will be blank) Date Medicare IM given:   Date Additional Medicare IM given:    Discharge Disposition:  HOME/SELF CARE  Per UR Regulation:  Reviewed for med. necessity/level of care/duration of stay  If discussed at [place] Length of Stay Meetings, dates discussed:    Comments:  05/07/12 [facility] RN,BSN [phone]

## 2012-05-09 ENCOUNTER — Telehealth: Payer: Self-pay | Admitting: Hematology & Oncology

## 2012-05-09 ENCOUNTER — Other Ambulatory Visit: Payer: Self-pay | Admitting: Hematology & Oncology

## 2012-05-09 DIAGNOSIS — D571 Sickle-cell disease without crisis: Secondary | ICD-10-CM

## 2012-05-09 NOTE — Telephone Encounter (Signed)
Pt aware of 2-27 appointment

## 2012-05-16 LAB — TYPE AND SCREEN: ABO/RH(D): A POS

## 2012-05-17 ENCOUNTER — Other Ambulatory Visit (HOSPITAL_BASED_OUTPATIENT_CLINIC_OR_DEPARTMENT_OTHER): Payer: Medicaid Other | Admitting: Lab

## 2012-05-17 ENCOUNTER — Ambulatory Visit (HOSPITAL_BASED_OUTPATIENT_CLINIC_OR_DEPARTMENT_OTHER): Payer: Medicaid Other | Admitting: Medical

## 2012-05-17 VITALS — BP 111/54 | HR 75 | Temp 98.0°F | Resp 16 | Ht 66.0 in | Wt 156.0 lb

## 2012-05-17 DIAGNOSIS — D571 Sickle-cell disease without crisis: Secondary | ICD-10-CM

## 2012-05-17 DIAGNOSIS — G8929 Other chronic pain: Secondary | ICD-10-CM

## 2012-05-17 DIAGNOSIS — R51 Headache: Secondary | ICD-10-CM

## 2012-05-17 LAB — CHCC SATELLITE - SMEAR

## 2012-05-17 LAB — CBC WITH DIFFERENTIAL (CANCER CENTER ONLY)
BASO%: 0.1 % (ref 0.0–2.0)
EOS%: 1.2 % (ref 0.0–7.0)
LYMPH#: 3.9 10*3/uL — ABNORMAL HIGH (ref 0.9–3.3)
MCHC: 36.4 g/dL — ABNORMAL HIGH (ref 32.0–36.0)
NEUT#: 5.7 10*3/uL (ref 1.5–6.5)
RDW: 22.9 % — ABNORMAL HIGH (ref 11.1–15.7)
WBC: 10.4 10*3/uL — ABNORMAL HIGH (ref 3.9–10.0)

## 2012-05-17 LAB — TECHNOLOGIST REVIEW CHCC SATELLITE

## 2012-05-17 NOTE — Progress Notes (Signed)
Diagnoses: #1.  Hemoglobin, SS.  Disease. #2.  Systemic lupus.  Current therapy: #1.  Folic acid 1 mg by mouth daily. #2.  Plaquenil 20 mg by mouth twice a day, deep #3.  Prednisone 10 mg by mouth daily.  Interim history:.  [name] presents today for an office followup visit.  Overall, she, reports, that she's been doing relatively well.  She went to [place] to study abroad.  She went in early September was ever there for 4 months.  She did not have a crisis while she was over there..  She, reports, that, since she's been back and started school.  She recently was at [facility] to receive a unit of blood.  This was back on 05/06/2012.  Her hemoglobin at that time, was 5.6.  She, reports, that she feels, her best, when her hemoglobin is 7ish.  Her hemoglobin is 7.2 today.  The last, hemoglobin, electrophoresis on her showed, 27% hemoglobin A, and 63%, hemoglobin, S.  This was back in July.  Of note, she did have to have an exchange transfusion prior to going to [place].  She, reports, a good appetite.  She denies any nausea, vomiting, diarrhea, constipation, any chest pain, shortness of breath, or cough.  She denies any fevers, chills, or night sweats.  She denies any lower leg swelling.  She denies any changes in her bowels or bladder.  She denies any , visual changes, or rashes.  Of note, she does report, that she's been having some chronic headaches.  She states, that she takes oxycodone, however, it is not relieve the pain.  Her primary care physician is in [place].  I did offer her referral to neurology.  She agreed to this.    Review of Systems: Constitutional:Negative for malaise/fatigue, fever, chills, weight loss, diaphoresis, activity change, appetite change, and unexpected weight change.  HEENT: Negative for double vision, blurred vision, visual loss, ear pain, tinnitus, congestion, rhinorrhea, epistaxis sore throat or sinus disease, oral pain/lesion, tongue soreness Respiratory:  Negative for cough, chest tightness, shortness of breath, wheezing and stridor.  Cardiovascular: Negative for chest pain, palpitations, leg swelling, orthopnea, PND, DOE or claudication Gastrointestinal: Negative for nausea, vomiting, abdominal pain, diarrhea, constipation, blood in stool, melena, hematochezia, abdominal distention, anal bleeding, rectal pain, anorexia and hematemesis.  Genitourinary: Negative for dysuria, frequency, hematuria,  Musculoskeletal: Negative for myalgias, back pain, joint swelling, arthralgias and gait problem.  Skin: Negative for rash, color change, pallor and wound.  Neurological:. Negative for dizziness/light-headedness, tremors, seizures, syncope, facial asymmetry, speech difficulty, weakness, numbness, headaches and paresthesias.  Hematological: Negative for adenopathy. Does not bruise/bleed easily.  Psychiatric/Behavioral:  Negative for depression, no loss of interest in normal activity or change in sleep pattern.   Physical Exam:this is a thin, but well-developed, well-nourished, American female, in no obvious distress  Vitals: temperature 98.0 degrees, pulse 75, respirations 16, blood pressure 111/54.  Weight 156 pounds HEENT reveals a normocephalic, atraumatic skull, no scleral icterus, no oral lesions  Neck is supple without any cervical or supraclavicular adenopathy.  Lungs are clear to auscultation bilaterally. There are no wheezes, rales or rhonci Cardiac is regular rate and rhythm with a normal S1 and S2. There are no murmurs, rubs, or bruits.  Abdomen is soft with good bowel sounds, there is no palpable mass. There is no palpable hepatosplenomegaly. There is no palpable fluid wave.  Musculoskeletal no tenderness of the spine, ribs, or hips.  Extremities there are no clubbing, cyanosis, or edema.  Skin  no petechia, purpura or ecchymosis Neurologic is nonfocal.  Laboratory Data:  white count 10.4, hemoglobin 7.2, hematocrit 19.8, platelets 442,000    Current Outpatient Prescriptions on File Prior to Visit  Medication Sig Dispense Refill  . folic acid (FOLVITE) 1 MG tablet Take 1 mg by mouth Daily.      Marland Kitchen HYDROmorphone (DILAUDID) 4 MG tablet Take 4 mg by mouth every 4 (four) hours as needed for pain.      Marland Kitchen levonorgestrel-ethinyl estradiol (ORSYTHIA) 0.1-20 MG-MCG tablet Take 1 tablet by mouth daily.      Marland Kitchen oxyCODONE (ROXICODONE) 15 MG immediate release tablet Take 15 mg by mouth every 6 (six) hours as needed for pain.      . valACYclovir (VALTREX) 500 MG tablet Take 500 mg by mouth 2 (two) times daily as needed. For outbreak       No current facility-administered medications on file prior to visit.   Assessment/Plan:  This is a pleasant, 22 year old, African American, female, following issues:  #1.  Hemoglobin, SS.  Disease.  Overall, she is a relatively well.  She did have a blood transfusion of 1 unit.  Back on February 16.  Her hemoglobin is 7.2.  We will continue to monitor her closely.  #2.  Chronic headaches.  I'm a referral to neurology.  #3.  Followup.  [name] will follow back up with [place] in one month, but before then should there be questions or concerns.  #2.

## 2012-05-21 LAB — RETICULOCYTES (CHCC)
ABS Retic: 300.8 10*3/uL — ABNORMAL HIGH (ref 19.0–186.0)
RBC.: 2.35 MIL/uL — ABNORMAL LOW (ref 3.87–5.11)
Retic Ct Pct: 12.8 % — ABNORMAL HIGH (ref 0.4–2.3)

## 2012-05-21 LAB — HEMOGLOBINOPATHY EVALUATION: Hgb A: 7.5 % — ABNORMAL LOW (ref 96.8–97.8)

## 2012-05-21 LAB — IRON AND TIBC
%SAT: 87 % — ABNORMAL HIGH (ref 20–55)
TIBC: 334 ug/dL (ref 250–470)

## 2012-05-21 LAB — FERRITIN: Ferritin: 149 ng/mL (ref 10–291)

## 2012-05-22 ENCOUNTER — Telehealth: Payer: Self-pay | Admitting: Hematology & Oncology

## 2012-05-22 NOTE — Telephone Encounter (Addendum)
Message copied by Cathi Roan on Tue May 22, 2012 10:47 AM ------      Message from: [name]      Created: Sat May 19, 2012  8:37 AM       Please call and make sure that she is taking folic acid 2 mg a day. If necessary call a prescription in for her. Thank you Pete ------  05-22-12  Called and spoke with patient at home, states she was taking folic acid OTC. Patient asked, that we call a prescription into [facility] for the new dose, she will d/c the OTC and started the 2 mg. Prescription.  Lupita Raider LPN

## 2012-05-28 ENCOUNTER — Telehealth: Payer: Self-pay | Admitting: Hematology & Oncology

## 2012-05-28 NOTE — Telephone Encounter (Signed)
Faxed referral to [facility] at [phone], phone is [phone]

## 2012-06-04 ENCOUNTER — Telehealth: Payer: Self-pay | Admitting: Hematology & Oncology

## 2012-06-04 NOTE — Telephone Encounter (Signed)
Left pt message moved time of 3-27 appointment

## 2012-06-07 ENCOUNTER — Telehealth: Payer: Self-pay | Admitting: Hematology & Oncology

## 2012-06-07 NOTE — Telephone Encounter (Signed)
Diane from [facility] said they called pt to schedule appointment on 05-29-12 but she did not call them back. I called pt see if she was going to call them back.  She said she was intending to call them back today and still wanted to see them.

## 2012-06-07 NOTE — Telephone Encounter (Signed)
Left message for Diane to see if pt has been scheduled yet from referral.

## 2012-06-14 ENCOUNTER — Other Ambulatory Visit: Payer: Medicaid Other | Admitting: Lab

## 2012-06-14 ENCOUNTER — Telehealth: Payer: Self-pay | Admitting: Hematology & Oncology

## 2012-06-14 ENCOUNTER — Ambulatory Visit: Payer: Medicaid Other | Admitting: Hematology & Oncology

## 2012-06-14 NOTE — Telephone Encounter (Signed)
Pt scheduled 4-10 from missed 3-27

## 2012-06-28 ENCOUNTER — Ambulatory Visit (HOSPITAL_BASED_OUTPATIENT_CLINIC_OR_DEPARTMENT_OTHER): Payer: Medicaid Other | Admitting: Hematology & Oncology

## 2012-06-28 ENCOUNTER — Ambulatory Visit (HOSPITAL_BASED_OUTPATIENT_CLINIC_OR_DEPARTMENT_OTHER): Payer: Medicaid Other | Admitting: Lab

## 2012-06-28 DIAGNOSIS — D571 Sickle-cell disease without crisis: Secondary | ICD-10-CM

## 2012-06-28 DIAGNOSIS — M329 Systemic lupus erythematosus, unspecified: Secondary | ICD-10-CM

## 2012-06-28 LAB — CBC WITH DIFFERENTIAL (CANCER CENTER ONLY)
EOS%: 1.4 % (ref 0.0–7.0)
HGB: 7 g/dL — CL (ref 11.6–15.9)
LYMPH#: 4.5 10*3/uL — ABNORMAL HIGH (ref 0.9–3.3)
MCH: 32.4 pg (ref 26.0–34.0)
MCHC: 37.2 g/dL — ABNORMAL HIGH (ref 32.0–36.0)
MONO%: 8.5 % (ref 0.0–13.0)
NEUT#: 5.1 10*3/uL (ref 1.5–6.5)
Platelets: 445 10*3/uL — ABNORMAL HIGH (ref 145–400)
RBC: 2.16 10*6/uL — ABNORMAL LOW (ref 3.70–5.32)

## 2012-06-28 LAB — CHCC SATELLITE - SMEAR

## 2012-06-28 NOTE — Progress Notes (Signed)
This office note has been dictated.

## 2012-06-29 NOTE — Progress Notes (Signed)
DIAGNOSIS: 1. Hemoglobin SS disease. 2. Systemic lupus.  CURRENT THERAPY:  Folic acid 1.2 mg p.o. daily.  INTERIM HISTORY:  Ms. Gianfrancesco comes in for a followup.  She was last seen back in February.  She had been hospitalized.  She had a little bit of a crisis.  She got through this.  Of note, she had been in [place] before that.  She went in September of last year.  She was part of a study program over there.  She had a good time.  Her lupus has not been all that bad.  She really has had no problems with this.  I do not think she is on Plaquenil or prednisone right now.  She has had no fevers, sweats, or chills.  She has had no change in bowel or bladder habits.  She has had no headache.  When we last saw her, her ferritin was 143.  Her iron saturation was 87%.  PHYSICAL EXAM:  General:  This is a well-developed, well-nourished African American female in no obvious distress.  Vital signs: Temperature of 98.7, pulse of 76, respiratory rate 16, blood pressure 105/55.  Weight is 156.  Head and neck:  Normocephalic, atraumatic skull.  There are no ocular or oral lesions.  She has no palpable cervical or supraclavicular lymph nodes.  Lungs:  Clear to percussion and auscultation bilaterally.  There are no rales, wheezes, or rhonchi. Cardiac:  Regular rate and rhythm with a normal S1 and S2.  There are no murmurs, rubs, or bruits.  Abdomen:  Soft with good bowel sounds.  There is no palpable abdominal mass.  There is no fluid wave.  There is no palpable hepatosplenomegaly.  Back:  No tenderness over the spine, ribs, or hips.  Extremities:  No clubbing, cyanosis, or edema.  She has a good range of motion of her joints.  Skin:  No rashes, ecchymoses, or petechiae.  LABORATORY STUDIES:  White cell count is 10.7, hemoglobin 7, hematocrit 18.8, and platelet count 445.  MCV is 87.  IMPRESSION:  Ms. Wherry is a 22 year old African American female with hemoglobin SS disease.  She is pretty well  compensated with a hemoglobin at 7.  She is really not that symptomatic.  She really does not want have any kind of transfusion or exchange transfusion.  She does not have any kind of central line that we could utilize easily for this.  She will finish up her next to last semester of college in a month.  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  We will plan to get her back in another 2 months.  We will be checking her hemoglobin electrophoresis.  We are always checking her iron studies.    ______________________________ [name], M.D. PRE/MEDQ  D:  06/28/2012  T:  06/29/2012  Job:  [id number]

## 2012-07-02 LAB — FERRITIN: Ferritin: 164 ng/mL (ref 10–291)

## 2012-07-02 LAB — HEMOGLOBINOPATHY EVALUATION

## 2012-07-02 LAB — IRON AND TIBC: UIBC: 170 ug/dL (ref 125–400)

## 2012-07-02 LAB — RETICULOCYTES (CHCC): ABS Retic: 316 10*3/uL — ABNORMAL HIGH (ref 19.0–186.0)

## 2012-08-30 ENCOUNTER — Other Ambulatory Visit: Payer: Medicaid Other | Admitting: Lab

## 2012-08-30 ENCOUNTER — Ambulatory Visit: Payer: Medicaid Other | Admitting: Hematology & Oncology

## 2012-09-12 ENCOUNTER — Encounter (HOSPITAL_COMMUNITY): Payer: Self-pay | Admitting: Emergency Medicine

## 2012-09-12 ENCOUNTER — Emergency Department (HOSPITAL_COMMUNITY)
Admission: EM | Admit: 2012-09-12 | Discharge: 2012-09-12 | Disposition: A | Discharge status: Home / Self Care | Payer: Medicaid Other | Attending: Emergency Medicine | Admitting: Emergency Medicine

## 2012-09-12 DIAGNOSIS — M549 Dorsalgia, unspecified: Secondary | ICD-10-CM

## 2012-09-12 DIAGNOSIS — R112 Nausea with vomiting, unspecified: Secondary | ICD-10-CM | POA: Insufficient documentation

## 2012-09-12 DIAGNOSIS — IMO0001 Reserved for inherently not codable concepts without codable children: Secondary | ICD-10-CM | POA: Insufficient documentation

## 2012-09-12 DIAGNOSIS — R011 Cardiac murmur, unspecified: Secondary | ICD-10-CM | POA: Insufficient documentation

## 2012-09-12 DIAGNOSIS — M79609 Pain in unspecified limb: Secondary | ICD-10-CM | POA: Insufficient documentation

## 2012-09-12 DIAGNOSIS — M2550 Pain in unspecified joint: Secondary | ICD-10-CM | POA: Insufficient documentation

## 2012-09-12 DIAGNOSIS — R52 Pain, unspecified: Secondary | ICD-10-CM | POA: Insufficient documentation

## 2012-09-12 DIAGNOSIS — D5701 Hb-SS disease with acute chest syndrome: Secondary | ICD-10-CM | POA: Insufficient documentation

## 2012-09-12 DIAGNOSIS — M545 Low back pain, unspecified: Secondary | ICD-10-CM | POA: Insufficient documentation

## 2012-09-12 DIAGNOSIS — Z79899 Other long term (current) drug therapy: Secondary | ICD-10-CM | POA: Insufficient documentation

## 2012-09-12 DIAGNOSIS — D5700 Hb-SS disease with crisis, unspecified: Secondary | ICD-10-CM | POA: Insufficient documentation

## 2012-09-12 DIAGNOSIS — Z8739 Personal history of other diseases of the musculoskeletal system and connective tissue: Secondary | ICD-10-CM | POA: Insufficient documentation

## 2012-09-12 LAB — URINALYSIS, ROUTINE W REFLEX MICROSCOPIC
Glucose, UA: NEGATIVE mg/dL
Protein, ur: 100 mg/dL — AB

## 2012-09-12 LAB — COMPREHENSIVE METABOLIC PANEL
ALT: 18 U/L (ref 0–35)
AST: 51 U/L — ABNORMAL HIGH (ref 0–37)
Albumin: 4.1 g/dL (ref 3.5–5.2)
Alkaline Phosphatase: 78 U/L (ref 39–117)
Chloride: 104 mEq/L (ref 96–112)
Creatinine, Ser: 0.51 mg/dL (ref 0.50–1.10)
Potassium: 4 mEq/L (ref 3.5–5.1)
Sodium: 137 mEq/L (ref 135–145)
Total Bilirubin: 3.1 mg/dL — ABNORMAL HIGH (ref 0.3–1.2)

## 2012-09-12 LAB — CBC WITH DIFFERENTIAL/PLATELET
Basophils Relative: 0 % (ref 0–1)
Eosinophils Relative: 1 % (ref 0–5)
Hemoglobin: 7 g/dL — ABNORMAL LOW (ref 12.0–15.0)
Lymphocytes Relative: 25 % (ref 12–46)
Monocytes Relative: 7 % (ref 3–12)
Neutrophils Relative %: 67 % (ref 43–77)
Platelets: 376 10*3/uL (ref 150–400)
RBC: 2.2 MIL/uL — ABNORMAL LOW (ref 3.87–5.11)
WBC: 12.4 10*3/uL — ABNORMAL HIGH (ref 4.0–10.5)

## 2012-09-12 LAB — URINE MICROSCOPIC-ADD ON

## 2012-09-12 MED ORDER — PROMETHAZINE HCL 25 MG PO TABS: 25.0000 mg | ORAL_TABLET | Freq: Four times a day (QID) | ORAL | Status: DC | PRN

## 2012-09-12 NOTE — ED Notes (Signed)
ZOX:[id number]<AV> Expected date:<BR> Expected time:<BR> Means of arrival:<BR> Comments:<BR> Sicklecell

## 2012-09-12 NOTE — ED Provider Notes (Signed)
Medical screening examination/treatment/procedure(s) were performed by non-physician practitioner and as supervising physician I was immediately available for consultation/collaboration. Devoria Albe, MD, Armando Gang   Ward Givens, MD 09/12/12 [phone]

## 2012-09-12 NOTE — ED Notes (Signed)
Pt has feels like she is having a sickle cell crisis and was given mg of Zofran in route to ED. Pt states her legs were starting to go numb and she took ibproufen and could not keep it down. Pt states she feels weak.

## 2012-09-12 NOTE — ED Provider Notes (Signed)
History    CSN: [id number] Arrival date & time 09/12/12  1647  First MD Initiated Contact with Patient 09/12/12 1659     Chief Complaint  Patient presents with  . Sickle Cell Pain Crisis   (Consider location/radiation/quality/duration/timing/severity/associated sxs/prior Treatment) HPI Comments: Patient with history of sickle cell disease, last admission 04/2012, presents with acute onset of lower back and leg pain at approximately 4 PM. This pain is typical for her sickle cell pain. Patient states that she took ibuprofen for the pain and vomited. She called EMS for transport to the hospital. Pain has since resolved. She denies chest pain, fever, shortness of breath. No current medical illnesses. Patient had a sore throat last week but this has been improving. No diarrhea or urinary symptoms. Patient takes hydromorphone and oxycodone IR on an as-needed basis but has not taken any today. The onset of this condition was acute. The course is constant. Aggravating factors: none. Alleviating factors: none.    Patient is a 22 y.o. female presenting with sickle cell pain. The history is provided by the patient and medical records.  Sickle Cell Pain Crisis Associated symptoms: nausea and vomiting   Associated symptoms: no chest pain, no cough, no fever, no headaches and no sore throat    Past Medical History  Diagnosis Date  . Sickle cell anemia   . 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 Lupus   . Heart murmur   . Sickle cell anemia 05/06/2012   Past Surgical History  Procedure Laterality Date  . Cholecystectomy     Family History  Problem Relation Age of Onset  . Sickle cell trait Mother   . Sickle cell trait Father   . Breast cancer Maternal Aunt     X 2  . Cancer Maternal Grandfather     Pancreatic Cancer  . Diabetes Maternal Aunt    History  Substance Use Topics  . Smoking status: Never Smoker   . Smokeless tobacco: Not on file  . Alcohol Use: No   OB History   Grav  Para Term Preterm Abortions TAB SAB Ect Mult Living                 Review of Systems  Constitutional: Negative for fever.  HENT: Negative for sore throat and rhinorrhea.   Eyes: Negative for redness.  Respiratory: Negative for cough.   Cardiovascular: Negative for chest pain.  Gastrointestinal: Positive for nausea and vomiting. Negative for abdominal pain and diarrhea.  Genitourinary: Negative for dysuria.  Musculoskeletal: Positive for myalgias, back pain and arthralgias.  Skin: Negative for rash.  Neurological: Negative for headaches.    Allergies  Ibuprofen  Home Medications   Current Outpatient Rx  Name  Route  Sig  Dispense  Refill  . folic acid (FOLVITE) 400 MCG tablet   Oral   Take 400 mcg by mouth daily.         Marland Kitchen HYDROmorphone (DILAUDID) 4 MG tablet   Oral   Take 4 mg by mouth every 4 (four) hours as needed for pain.         Marland Kitchen levonorgestrel-ethinyl estradiol (ORSYTHIA) 0.1-20 MG-MCG tablet   Oral   Take 1 tablet by mouth daily.         Marland Kitchen oxyCODONE (ROXICODONE) 15 MG immediate release tablet   Oral   Take 15 mg by mouth every 6 (six) hours as needed for pain.         . valACYclovir (VALTREX) 500 MG tablet  Oral   Take 500 mg by mouth 2 (two) times daily as needed. For outbreak          BP 106/49  Pulse 60  Temp(Src) 97.8 F (36.6 C) (Oral)  Resp 16  SpO2 96%  LMP 09/12/2012  Physical Exam  Nursing note and vitals reviewed. Constitutional: She appears well-developed and well-nourished.  HENT:  Head: Normocephalic and atraumatic.  Eyes: Conjunctivae are normal. Right eye exhibits no discharge. Left eye exhibits no discharge.  Neck: Normal range of motion. Neck supple.  Cardiovascular: Normal rate, regular rhythm and normal heart sounds.   Pulmonary/Chest: Effort normal and breath sounds normal.  Abdominal: Soft. There is no tenderness.  Neurological: She is alert.  Skin: Skin is warm and dry.  Psychiatric: She has a normal mood and  affect.    ED Course  Procedures (including critical care time) Labs Reviewed  CBC WITH DIFFERENTIAL - Abnormal; Notable for the following:    WBC 12.4 (*)    RBC 2.20 (*)    Hemoglobin 7.0 (*)    HCT 19.5 (*)    RDW 23.0 (*)    Neutro Abs 8.3 (*)    All other components within normal limits  COMPREHENSIVE METABOLIC PANEL - Abnormal; Notable for the following:    BUN 5 (*)    AST 51 (*)    Total Bilirubin 3.1 (*)    All other components within normal limits  URINALYSIS, ROUTINE W REFLEX MICROSCOPIC - Abnormal; Notable for the following:    Color, Urine AMBER (*)    APPearance CLOUDY (*)    Hgb urine dipstick MODERATE (*)    Bilirubin Urine SMALL (*)    Protein, ur 100 (*)    Leukocytes, UA TRACE (*)    All other components within normal limits  URINE MICROSCOPIC-ADD ON - Abnormal; Notable for the following:    Squamous Epithelial / LPF FEW (*)    Bacteria, UA FEW (*)    All other components within normal limits   No results found. 1. Back pain   2. Sickle cell anemia with pain     5:14 PM Patient seen and examined. Work-up initiated. Medications ordered.   Vital signs reviewed and are as follows: Filed Vitals:   09/12/12 1658  BP: 106/49  Pulse: 60  Temp: 97.8 F (36.6 C)  Resp: 16   Pain currently resolved. Will check basic labs and monitor. If patient remains well, I suspect that she will be able to go home.   Hemoglobin is low, but at her baseline. Pain is still gone. She appears well and is comfortable with return to home with nausea medication to use her home pain medications.   Patient urged to return with worsening symptoms or other concerns. Patient verbalized understanding and agrees with plan.       MDM  Pt with sickle cell anemia, short-lived lower back and leg pain same as her typical pain. She did have vomiting. She is now tolerating PO's. Pain is resolved. Abd soft, NT. Do not suspect mesenteric ischemia. No CP/SOB to indicate PNA, acute chest  syndrome. As her labs are stable and her symptoms resolved, no further work-up indicated. Return instructions given.   Renne Crigler, PA-C 09/12/12 1943

## 2012-09-12 NOTE — ED Notes (Signed)
Pain in chest and joints x 3 days. Evaluated, treated and discharged from [facility] this am for same symptoms

## 2012-10-01 ENCOUNTER — Telehealth: Payer: Self-pay | Admitting: Hematology & Oncology

## 2012-10-01 NOTE — Telephone Encounter (Signed)
Pt made 7-17 appointment

## 2012-10-04 ENCOUNTER — Other Ambulatory Visit (HOSPITAL_BASED_OUTPATIENT_CLINIC_OR_DEPARTMENT_OTHER): Payer: Medicaid Other | Admitting: Lab

## 2012-10-04 ENCOUNTER — Ambulatory Visit (HOSPITAL_BASED_OUTPATIENT_CLINIC_OR_DEPARTMENT_OTHER): Payer: Medicaid Other | Admitting: Hematology & Oncology

## 2012-10-04 VITALS — BP 108/53 | HR 66 | Temp 98.6°F | Resp 16 | Ht 66.0 in | Wt 156.0 lb

## 2012-10-04 DIAGNOSIS — D571 Sickle-cell disease without crisis: Secondary | ICD-10-CM

## 2012-10-04 LAB — CBC WITH DIFFERENTIAL (CANCER CENTER ONLY)
BASO#: 0 10*3/uL (ref 0.0–0.2)
BASO%: 0.2 % (ref 0.0–2.0)
EOS%: 1.1 % (ref 0.0–7.0)
HGB: 7 g/dL — ABNORMAL LOW (ref 11.6–15.9)
LYMPH#: 4 10*3/uL — ABNORMAL HIGH (ref 0.9–3.3)
MCHC: 36.3 g/dL — ABNORMAL HIGH (ref 32.0–36.0)
NEUT#: 6.1 10*3/uL (ref 1.5–6.5)
RDW: 22 % — ABNORMAL HIGH (ref 11.1–15.7)
WBC: 11.3 10*3/uL — ABNORMAL HIGH (ref 3.9–10.0)

## 2012-10-04 LAB — TECHNOLOGIST REVIEW CHCC SATELLITE: Tech Review: 2

## 2012-10-04 NOTE — Progress Notes (Signed)
This office note has been dictated.

## 2012-10-05 LAB — IRON AND TIBC CHCC
%SAT: 43 % (ref 21–57)
Iron: 145 ug/dL — ABNORMAL HIGH (ref 41–142)
TIBC: 336 ug/dL (ref 236–444)

## 2012-10-08 LAB — RETICULOCYTES (CHCC)
ABS Retic: 374.5 10*3/uL — ABNORMAL HIGH (ref 19.0–186.0)
RBC.: 2.14 MIL/uL — ABNORMAL LOW (ref 3.87–5.11)

## 2012-10-08 NOTE — Progress Notes (Signed)
CC:   Whitney Snipe, MD, Fax [phone]  DIAGNOSES: 1. Hemoglobin SS disease. 2. Lupus.  CURRENT THERAPY:  Folic acid 2 mg p.o. q. day.  INTERIM HISTORY:  [name] comes in for a followup.  She has done quite well.  She has gotten back from [name].  She had a good time over there.  She was on summer study program over there.  She had no flare-ups of the sickle cell or of her lupus.  She has had no problems with fevers, sweats, or chills.  There is no cough or shortness breath.  She has had no rashes.  There has been no change in bowel or bladder habits.  She has not noted any problems with leg swelling.  We are following her iron studies.  The last time we checked her iron studies in April, her ferritin was 164, with an iron saturation of 45%.  PHYSICAL EXAM:  General:  This is a well-developed, well-nourished African American female in no obvious distress.  Vital signs: Temperature of 98.6, pulse 66, respiratory rate 16, blood pressure 108/53.  Weight is 156.  Head and neck:  Normocephalic, atraumatic skull.  There are no ocular or oral lesions.  She has no scleral icterus.  She has no adenopathy in the neck.  There are no rashes on her face.  Lungs:  Clear bilaterally.  Cardiac:  Regular rate and rhythm, with a normal S1 and S2.  There are no murmurs, rubs or bruits. Abdomen:  Soft with good bowel sounds.  There is no palpable abdominal mass.  There is no fluid wave.  There is no palpable hepatosplenomegaly. Extremities:  No clubbing, cyanosis or edema.  Neurological:  No focal neurological deficits.  LABORATORY STUDIES:  White cell count 11.3, hemoglobin 7, hematocrit 19.3, platelet count 403.  MCV is 92.  IMPRESSION:  [name] is a very charming 22 year old African American female with hemoglobin SS disease.  She also has lupus.  Thankfully, neither have caused her problems.  She typically runs a hemoglobin of 7.  She does have typically a fairly high reticulocyte  count.  She is on folic acid, which she takes diligently.  I do not see that we need to do anything with her right now.  She starts school for her last year in college next month.  We will plan to get her back in another 3 months, unless there are problems in between.    ______________________________ [name], M.D. PRE/MEDQ  D:  10/04/2012  T:  10/05/2012  Job:  [id number]

## 2012-11-11 ENCOUNTER — Inpatient Hospital Stay (HOSPITAL_COMMUNITY)
Admission: EM | Admit: 2012-11-11 | Discharge: 2012-11-12 | DRG: 812 | Disposition: A | Discharge status: Home / Self Care | Payer: Medicaid Other | Attending: Internal Medicine | Admitting: Internal Medicine

## 2012-11-11 ENCOUNTER — Emergency Department (HOSPITAL_COMMUNITY): Payer: Medicaid Other

## 2012-11-11 ENCOUNTER — Encounter (HOSPITAL_COMMUNITY): Payer: Self-pay | Admitting: *Deleted

## 2012-11-11 DIAGNOSIS — Z79899 Other long term (current) drug therapy: Secondary | ICD-10-CM

## 2012-11-11 DIAGNOSIS — R011 Cardiac murmur, unspecified: Secondary | ICD-10-CM | POA: Diagnosis present

## 2012-11-11 DIAGNOSIS — R0989 Other specified symptoms and signs involving the circulatory and respiratory systems: Secondary | ICD-10-CM | POA: Diagnosis present

## 2012-11-11 DIAGNOSIS — J811 Chronic pulmonary edema: Secondary | ICD-10-CM

## 2012-11-11 DIAGNOSIS — D72829 Elevated white blood cell count, unspecified: Secondary | ICD-10-CM | POA: Diagnosis present

## 2012-11-11 DIAGNOSIS — D649 Anemia, unspecified: Secondary | ICD-10-CM

## 2012-11-11 DIAGNOSIS — D571 Sickle-cell disease without crisis: Secondary | ICD-10-CM

## 2012-11-11 DIAGNOSIS — M329 Systemic lupus erythematosus, unspecified: Secondary | ICD-10-CM | POA: Diagnosis present

## 2012-11-11 DIAGNOSIS — D5700 Hb-SS disease with crisis, unspecified: Principal | ICD-10-CM | POA: Diagnosis present

## 2012-11-11 LAB — URINALYSIS, ROUTINE W REFLEX MICROSCOPIC
Bilirubin Urine: NEGATIVE
Nitrite: NEGATIVE
Protein, ur: 30 mg/dL — AB
Specific Gravity, Urine: 1.014 (ref 1.005–1.030)
Urobilinogen, UA: 1 mg/dL (ref 0.0–1.0)

## 2012-11-11 LAB — CBC WITH DIFFERENTIAL/PLATELET
Basophils Absolute: 0 10*3/uL (ref 0.0–0.1)
Eosinophils Absolute: 0.2 10*3/uL (ref 0.0–0.7)
Lymphocytes Relative: 17 % (ref 12–46)
MCHC: 38.1 g/dL — ABNORMAL HIGH (ref 30.0–36.0)
Neutro Abs: 13 10*3/uL — ABNORMAL HIGH (ref 1.7–7.7)
Neutrophils Relative %: 74 % (ref 43–77)
RDW: 24 % — ABNORMAL HIGH (ref 11.5–15.5)

## 2012-11-11 LAB — BASIC METABOLIC PANEL
BUN: 6 mg/dL (ref 6–23)
GFR calc Af Amer: >90 mL/min (ref >90)
GFR calc non Af Amer: >90 mL/min (ref >90)
Potassium: 3.8 mEq/L (ref 3.5–5.1)
Sodium: 136 mEq/L (ref 135–145)

## 2012-11-11 LAB — URINE MICROSCOPIC-ADD ON

## 2012-11-11 LAB — RETICULOCYTES: Retic Ct Pct: 32.1 % — ABNORMAL HIGH (ref 0.4–3.1)

## 2012-11-11 MED ORDER — ENOXAPARIN SODIUM 40 MG/0.4ML ~~LOC~~ SOLN
40.0000 mg | SUBCUTANEOUS | Status: DC
  Administered 2012-11-11: 40 mg via SUBCUTANEOUS
  Filled 2012-11-11 (×2): qty 0.4

## 2012-11-11 MED ORDER — HYDROMORPHONE HCL PF 1 MG/ML IJ SOLN
1.0000 mg | Freq: Once | INTRAMUSCULAR | Status: AC
Start: 2012-11-11 — End: 2012-11-11
  Administered 2012-11-11: 1 mg via INTRAVENOUS
  Filled 2012-11-11: qty 1

## 2012-11-11 MED ORDER — HYDROMORPHONE HCL PF 1 MG/ML IJ SOLN: 1.0000 mg | INTRAMUSCULAR | Status: DC | PRN

## 2012-11-11 MED ORDER — DIPHENHYDRAMINE HCL 50 MG/ML IJ SOLN: 12.5000 mg | INTRAMUSCULAR | Status: DC | PRN

## 2012-11-11 MED ORDER — FOLIC ACID 1 MG PO TABS: 1.0000 mg | ORAL_TABLET | Freq: Every day | ORAL | Status: DC

## 2012-11-11 MED ORDER — PROMETHAZINE HCL 25 MG PO TABS: 12.5000 mg | ORAL_TABLET | ORAL | Status: DC | PRN

## 2012-11-11 MED ORDER — SODIUM CHLORIDE 0.9 % IV BOLUS (SEPSIS)
1000.0000 mL | Freq: Once | INTRAVENOUS | Status: AC
  Administered 2012-11-11: 1000 mL via INTRAVENOUS

## 2012-11-11 MED ORDER — DIPHENHYDRAMINE HCL 25 MG PO CAPS
25.0000 mg | ORAL_CAPSULE | ORAL | Status: DC | PRN
  Administered 2012-11-11: 25 mg via ORAL
  Filled 2012-11-11: qty 1

## 2012-11-11 MED ORDER — OXYCODONE HCL 5 MG PO TABS: 15.0000 mg | ORAL_TABLET | Freq: Four times a day (QID) | ORAL | Status: DC | PRN

## 2012-11-11 MED ORDER — ONDANSETRON HCL 4 MG/2ML IJ SOLN
4.0000 mg | Freq: Once | INTRAMUSCULAR | Status: AC
  Administered 2012-11-11: 4 mg via INTRAVENOUS
  Filled 2012-11-11: qty 2

## 2012-11-11 MED ORDER — VALACYCLOVIR HCL 500 MG PO TABS
500.0000 mg | ORAL_TABLET | Freq: Two times a day (BID) | ORAL | Status: DC | PRN
  Filled 2012-11-11: qty 1

## 2012-11-11 MED ORDER — SODIUM CHLORIDE 0.9 % IV SOLN
INTRAVENOUS | Status: DC
  Administered 2012-11-11: 11:00:00 via INTRAVENOUS

## 2012-11-11 MED ORDER — PROMETHAZINE HCL 25 MG RE SUPP: 12.5000 mg | RECTAL | Status: DC | PRN

## 2012-11-11 MED ORDER — SODIUM CHLORIDE 0.9 % IV SOLN: INTRAVENOUS | Status: DC

## 2012-11-11 MED ORDER — FOLIC ACID 1 MG PO TABS
1.0000 mg | ORAL_TABLET | Freq: Every day | ORAL | Status: DC
Start: 2012-11-11 — End: 2012-11-12
  Administered 2012-11-11 – 2012-11-12 (×2): 1 mg via ORAL
  Filled 2012-11-11 (×2): qty 1

## 2012-11-11 MED ORDER — HYDROMORPHONE HCL PF 2 MG/ML IJ SOLN: 2.0000 mg | INTRAMUSCULAR | Status: DC | PRN

## 2012-11-11 NOTE — ED Notes (Signed)
Report given to nurse on floor 

## 2012-11-11 NOTE — ED Provider Notes (Signed)
CSN: [id number]     Arrival date & time 11/11/12  0631 History     First MD Initiated Contact with Patient 11/11/12 0645     Chief Complaint  Patient presents with  . Sickle Cell Pain Crisis   (Consider location/radiation/quality/duration/timing/severity/associated sxs/prior Treatment) HPI Comments: Pt states that she had onset of crisis today:pt states that it feels similar to previous episodes:pt took her medications at home and it didn't help:pt states that she is she hasn't had to be admitted in the last year:  Patient is a 22 y.o. female presenting with sickle cell pain. The history is provided by the patient. No language interpreter was used.  Sickle Cell Pain Crisis Location:  Head and chest Severity:  Severe Onset quality:  Sudden Similar to previous crisis episodes: yes   Timing:  Constant Progression:  Unchanged Relieved by:  Nothing Worsened by:  Nothing tried Ineffective treatments:  None tried Associated symptoms: no fever     Past Medical History  Diagnosis Date  . Sickle cell anemia   . 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 Lupus   . Heart murmur   . Sickle cell anemia 05/06/2012   Past Surgical History  Procedure Laterality Date  . Cholecystectomy     Family History  Problem Relation Age of Onset  . Sickle cell trait Mother   . Sickle cell trait Father   . Breast cancer Maternal Aunt     X 2  . Cancer Maternal Grandfather     Pancreatic Cancer  . Diabetes Maternal Aunt    History  Substance Use Topics  . Smoking status: Never Smoker   . Smokeless tobacco: Not on file  . Alcohol Use: No   OB History   Grav Para Term Preterm Abortions TAB SAB Ect Mult Living                 Review of Systems  Constitutional: Negative for fever.  Respiratory: Negative.   Cardiovascular: Negative.     Allergies  Ibuprofen  Home Medications   Current Outpatient Rx  Name  Route  Sig  Dispense  Refill  . folic acid (FOLVITE) 1 MG tablet   Oral  Take 1 mg by mouth daily.         Marland Kitchen HYDROmorphone (DILAUDID) 4 MG tablet   Oral   Take 4 mg by mouth every 4 (four) hours as needed for pain.         Marland Kitchen oxyCODONE (ROXICODONE) 15 MG immediate release tablet   Oral   Take 15 mg by mouth every 6 (six) hours as needed for pain.         . valACYclovir (VALTREX) 500 MG tablet   Oral   Take 500 mg by mouth 2 (two) times daily as needed. For outbreak          BP 125/73  Pulse 99  Temp(Src) 98.5 F (36.9 C) (Oral)  Resp 22  SpO2 90% Physical Exam  Nursing note and vitals reviewed. Constitutional: She is oriented to person, place, and time. She appears well-developed and well-nourished.  HENT:  Head: Normocephalic and atraumatic.  Eyes: Conjunctivae and EOM are normal.  Neck: Neck supple.  Cardiovascular: Normal rate and regular rhythm.   Pulmonary/Chest: Effort normal and breath sounds normal. No respiratory distress.  Abdominal: Soft. Bowel sounds are normal. There is no tenderness.  Musculoskeletal: Normal range of motion.  Neurological: She is alert and oriented to person, place, and time.  Skin:  Skin is warm and dry.    ED Course   Procedures (including critical care time)  Labs Reviewed  CBC WITH DIFFERENTIAL - Abnormal; Notable for the following:    WBC 17.6 (*)    RBC 1.99 (*)    Hemoglobin 6.9 (*)    HCT 18.1 (*)    MCH 34.7 (*)    MCHC 38.1 (*)    RDW 24.0 (*)    Neutro Abs 13.0 (*)    Monocytes Absolute 1.4 (*)    All other components within normal limits  BASIC METABOLIC PANEL - Abnormal; Notable for the following:    Creatinine, Ser 0.44 (*)    All other components within normal limits  RETICULOCYTES - Abnormal; Notable for the following:    Retic Ct Pct 32.1 (*)    RBC. 1.99 (*)    Retic Count, Manual 638.8 (*)    All other components within normal limits  CULTURE, BLOOD (ROUTINE X 2)  CULTURE, BLOOD (ROUTINE X 2)  URINALYSIS, ROUTINE W REFLEX MICROSCOPIC  PREGNANCY, URINE   No results  found.  Date: 11/11/2012  Rate: 96  Rhythm: normal sinus rhythm  QRS Axis: normal  Intervals: normal  ST/T Wave abnormalities: normal  Conduction Disutrbances:none  Narrative Interpretation:   Old EKG Reviewed: unchanged   1. Sickle cell pain crisis   2. Pulmonary edema     MDM  Pt is comfortable at this time:pt not having any symptoms of pneumonia at this time:pt is to be admitted to be followed   Teressa Lower, NP 11/11/12 1030

## 2012-11-11 NOTE — ED Notes (Signed)
Pt tearful, c/o pain in head, neck, back, joint, legs. Generalized discomfort. She states pain onset 1 hour ago. Causing her to wake up out of her sleep. Denies N/V

## 2012-11-11 NOTE — ED Notes (Signed)
Per EMS pt from home brought to ED for sickle cell pain/crisis (joints, head, legs) C/o thoracic pain, chest wall pain.  Has taken Oxycodone 15mg  at home  VSS: BP 120/90 HR-100 NSR SPo2-98%RA

## 2012-11-11 NOTE — H&P (Signed)
[facility] History and Physical  Ellizabeth Dacruz AVW:[id number] DOB: 1990/11/19 DOA: 11/11/2012  Referring physician: ED physician PCP: No primary provider on file.   Chief Complaint: Sickle cell crisis   HPI:  Pt is 22 yo female with sickle cell disease, lupus, who presents to [facility] ED with main concern of progressively worsening generalized pain, mostly in her neck, arms, legs, chest, throbbing and constant, 10/10 in severity, associated with poor oral intake, malaise, no specific alleviating or aggravating factors, similar to her previous sickle cell crisis. She denies fevers, chills, any specific abdominal or urinary concerns, no specific focal neurolgical factors, no cough and no palpitation.   In ED, pt found to have Hg 6.9 which is at her baseline, persistent generalized pain. [facility] asked to admit to medical unit for further evaluation and management.  Assessment and Plan:  Principal Problem:   Sickle cell pain crisis - will admit pt to medical floor  - provide supportive care with analgesia and antiemetics as needed, oxygen as needed - apply k-pad to the affected area - no need for transfusion at this time as pt is at her baseline - CBC in AM Active Problems:   Sickle cell disease, type SS - management as noted above   Systemic lupus erythematosus - appears to be clinically stable at this time   Pulmonary vascular congestion - noted on xray and on exam, mild crackles at bases - hold of on IVF for now - reassess clinical status to determine need for IVF    Leukocytosis, unspecified - likely secondary to principal problem - no signs of an infectious etiology - CBC in AM  Code Status: Full Family Communication: Pt at bedside Disposition Plan: Admit to medical floor   Review of Systems:  Constitutional: Negative for fever, chills. Negative for diaphoresis.  HENT: Negative for hearing loss, ear pain, nosebleeds, congestion, sore throat, neck pain, tinnitus and ear  discharge.   Eyes: Negative for blurred vision, double vision, photophobia, pain, discharge and redness.  Respiratory: Negative for cough, hemoptysis, sputum production, shortness of breath, wheezing and stridor.   Cardiovascular: Negative for palpitations, orthopnea, claudication and leg swelling.  Gastrointestinal: Negative for nausea, vomiting. Negative for heartburn, constipation, blood in stool and melena.  Genitourinary: Negative for dysuria, urgency, frequency, hematuria and flank pain.  Musculoskeletal: Negative for falls.  Skin: Negative for itching and rash.  Neurological: Negative for tingling, tremors, sensory change, speech change, focal weakness, loss of consciousness and headaches.  Endo/Heme/Allergies: Negative for environmental allergies and polydipsia. Does not bruise/bleed easily.  Psychiatric/Behavioral: Negative for suicidal ideas. The patient is not nervous/anxious.      Past Medical History  Diagnosis Date  . Sickle cell anemia   . 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 Lupus   . Heart murmur   . Sickle cell anemia 05/06/2012    Past Surgical History  Procedure Laterality Date  . Cholecystectomy      Social History:  reports that she has never smoked. She does not have any smokeless tobacco history on file. She reports that she does not drink alcohol or use illicit drugs.  Allergies  Allergen Reactions  . Ibuprofen Other (See Comments)    Due to other medications she takes for Lupus    Family History  Problem Relation Age of Onset  . Sickle cell trait Mother   . Sickle cell trait Father   . Breast cancer Maternal Aunt     X 2  . Cancer Maternal Grandfather  Pancreatic Cancer  . Diabetes Maternal Aunt     Prior to Admission medications   Medication Sig Start Date End Date Taking? Authorizing Provider  folic acid (FOLVITE) 1 MG tablet Take 1 mg by mouth daily.   Yes Historical Provider, MD  HYDROmorphone (DILAUDID) 4 MG tablet Take 4 mg by  mouth every 4 (four) hours as needed for pain.   Yes Historical Provider, MD  oxyCODONE (ROXICODONE) 15 MG immediate release tablet Take 15 mg by mouth every 6 (six) hours as needed for pain.   Yes Historical Provider, MD  valACYclovir (VALTREX) 500 MG tablet Take 500 mg by mouth 2 (two) times daily as needed. For outbreak   Yes Historical Provider, MD    Physical Exam: Filed Vitals:   11/11/12 1610 11/11/12 0815 11/11/12 0933 11/11/12 1100  BP: 125/73 106/50 112/61 96/55  Pulse: 99 90 85 81  Temp: 98.5 F (36.9 C)   97.4 F (36.3 C)  TempSrc: Oral   Oral  Resp: 22 20 16 16   Height:    5\' 8"  (1.727 m)  Weight:    74.254 kg (163 lb 11.2 oz)  SpO2: 90% 99% 98% 98%    Physical Exam  Constitutional: Appears well-developed and well-nourished. No distress.  HENT: Normocephalic. External right and left ear normal. Oropharynx is clear and moist.  Eyes: Conjunctivae and EOM are normal. PERRLA, no scleral icterus.  Neck: Normal ROM. Neck supple. No JVD. No tracheal deviation. No thyromegaly.  CVS: RRR, S1/S2 +, no murmurs, no gallops, no carotid bruit.  Pulmonary: Effort and breath sounds normal, bibasilar crackles  Abdominal: Soft. BS +,  no distension, tenderness in epigastric area, no rebound or guarding.  Musculoskeletal: Normal range of motion. No edema.  Lymphadenopathy: No lymphadenopathy noted, cervical, inguinal. Neuro: Alert. Normal reflexes, muscle tone coordination. No cranial nerve deficit. Skin: Skin is warm and dry. No rash noted. Not diaphoretic. No erythema. No pallor.  Psychiatric: Normal mood and affect. Behavior, judgment, thought content normal.   Labs on Admission:  Basic Metabolic Panel:  Recent Labs Lab 11/11/12 0825  NA 136  K 3.8  CL 103  CO2 22  GLUCOSE 90  BUN 6  CREATININE 0.44*  CALCIUM 8.7   CBC:  Recent Labs Lab 11/11/12 0825  WBC 17.6*  NEUTROABS 13.0*  HGB 6.9*  HCT 18.1*  MCV 91.0  PLT 337   Radiological Exams on Admission: Dg  Chest 2 View  11/11/2012  Stable cardiac enlargement.  Subtle airspace lung opacities are new, which, along with central vascular congestion, suggests mild pulmonary edema.    EKG: Normal sinus rhythm, no ST/T wave changes  Debbora Presto, MD  [facility] Pager [phone]  If 7PM-7AM, please contact night-coverage [email or web address] Password [facility] 11/11/2012, 11:23 AM

## 2012-11-12 DIAGNOSIS — D72829 Elevated white blood cell count, unspecified: Secondary | ICD-10-CM

## 2012-11-12 DIAGNOSIS — J811 Chronic pulmonary edema: Secondary | ICD-10-CM

## 2012-11-12 LAB — CBC
HCT: 17.8 % — ABNORMAL LOW (ref 36.0–46.0)
Hemoglobin: 6.7 g/dL — CL (ref 12.0–15.0)
MCH: 33.3 pg (ref 26.0–34.0)
MCV: 91 fL (ref 78.0–100.0)
MCV: 90.8 fL (ref 78.0–100.0)
Platelets: 354 10*3/uL (ref 150–400)
RBC: 2.01 MIL/uL — ABNORMAL LOW (ref 3.87–5.11)
RBC: 1.96 MIL/uL — ABNORMAL LOW (ref 3.87–5.11)
WBC: 10.7 10*3/uL — ABNORMAL HIGH (ref 4.0–10.5)
WBC: 9.9 10*3/uL (ref 4.0–10.5)

## 2012-11-12 LAB — COMPREHENSIVE METABOLIC PANEL
AST: 49 U/L — ABNORMAL HIGH (ref 0–37)
Albumin: 3.5 g/dL (ref 3.5–5.2)
Calcium: 9.1 mg/dL (ref 8.4–10.5)
Creatinine, Ser: 0.46 mg/dL — ABNORMAL LOW (ref 0.50–1.10)
Total Protein: 7.1 g/dL (ref 6.0–8.3)

## 2012-11-12 MED ORDER — HYDROMORPHONE HCL 4 MG PO TABS: 4.0000 mg | ORAL_TABLET | ORAL | Status: DC | PRN

## 2012-11-12 MED ORDER — OXYCODONE HCL 15 MG PO TABS: 15.0000 mg | ORAL_TABLET | Freq: Four times a day (QID) | ORAL | Status: DC | PRN

## 2012-11-12 MED ORDER — DIPHENHYDRAMINE HCL 25 MG PO CAPS: 25.0000 mg | ORAL_CAPSULE | ORAL | Status: DC | PRN

## 2012-11-12 NOTE — ED Provider Notes (Signed)
  Medical screening examination/treatment/procedure(s) were performed by non-physician practitioner and as supervising physician I was immediately available for consultation/collaboration.    Gerhard Munch, MD 11/12/12 [phone]

## 2012-11-12 NOTE — Progress Notes (Signed)
Pt last dose of Dilaudid was 1mg  in the ED at 0730 on 8/24.  Pt slept throughout day and night with no s/s of distress.  Declined pain medication when offered.

## 2012-11-12 NOTE — Discharge Summary (Signed)
Physician Discharge Summary  [name] ZOX:[id number] DOB: June 21, 1990 DOA: 11/11/2012  PCP: No primary provider on file.  Admit date: 11/11/2012 Discharge date: 11/12/2012  Recommendations for Outpatient Follow-up:  1. Followup with primary care physician in one to 2 weeks after discharge to make sure your symptoms are adequately controlled  2. Your hemoglobin on this admission was 6.7 - 6.9; you mentioned you get transfusion if hemoglobin less than 6. You have not received transfusion during this hospital stay.  3. WBC count has trended down from 17 to 11 prior to discharge.  4. Your kidney function is within normal limits during this hospital stay.   Discharge Diagnoses:  Principal Problem:   Sickle cell pain crisis Active Problems:   Sickle cell disease, type SS   Systemic lupus erythematosus   Leukocytosis, unspecified   Pulmonary vascular congestion    Discharge Condition: Medically stable for discharge home today  Diet recommendation:  As tolerated   History of present illness:  22 yo female with past medical history of sickle cell disease, lupus, who presented to [facility] ED with main concern of progressively worsening generalized pain, mostly in her neck, arms, legs, chest, throbbing and constant, 10/10 in severity, associated with poor oral intake, malaise, no specific alleviating or aggravating factors, similar to her previous sickle cell crisis.  In ED, patient was found  to have Hg 6.9 which is at her baseline in addition to persistent generalized pain.   Assessment and Plan:   Principal Problem:  Sickle cell pain crisis  - Patient is doing much better this morning and has no significant complaints of pain - We will provide prescriptions for home pain medications which includes Dilaudid and oxycodone when necessary  Active Problems:   Sickle cell anemia  - Hemoglobin stable in range 6.7-6.9 - Patient reported that she gets transfusion only if hemoglobin less than  6 - Patient did not receive transfusion while in hospital Sickle cell disease, type SS  - Patient will follow with Dr. Margaretha Sheffield, she reported she will schedule her own appointment Systemic lupus erythematosus  - appears to be clinically stable at this time  Pulmonary vascular congestion  - noted on xray; stable Leukocytosis, unspecified  - likely secondary to principal problem  - no signs of an infectious etiology  - White blood cell count trended down from 17.6 to 10.7  Code Status: Full  Family Communication: No family at the bedside    Discharge Exam: Filed Vitals:   11/12/12 0948  BP: 103/55  Pulse: 73  Temp: 98.6 F (37 C)  Resp: 16   Filed Vitals:   11/11/12 2043 11/12/12 0207 11/12/12 0545 11/12/12 0948  BP: 100/41 102/50 101/46 103/55  Pulse: 86 78 75 73  Temp: 98.6 F (37 C) 98.3 F (36.8 C) 98.7 F (37.1 C) 98.6 F (37 C)  TempSrc: Oral Oral Oral Oral  Resp: 16 16 16 16   Height:      Weight:   71.85 kg (158 lb 6.4 oz)   SpO2: 96% 93% 92% 99%    General: Pt is alert, follows commands appropriately, not in acute distress Cardiovascular: Regular rate and rhythm, S1/S2 +, no murmurs, no rubs, no gallops Respiratory: Clear to auscultation bilaterally, no wheezing, no crackles, no rhonchi Abdominal: Soft, non tender, non distended, bowel sounds +, no guarding Extremities: no edema, no cyanosis, pulses palpable bilaterally DP and PT Neuro: Grossly nonfocal  Discharge Instructions  Discharge Orders   Future Appointments Provider Department Dept Phone  01/03/2013 10:00 AM Rachael Fee [facility] [facility] [phone]   01/03/2013 10:30 AM [name], MD [facility] [place] [phone]   Future Orders Complete By Expires   Call MD for:  difficulty breathing, headache or visual disturbances  As directed    Call MD for:  persistant dizziness or light-headedness  As directed    Call MD for:  persistant nausea  and vomiting  As directed    Call MD for:  severe uncontrolled pain  As directed    Diet - low sodium heart healthy  As directed    Discharge instructions  As directed    Comments:     1. Followup with primary care physician in one to 2 weeks after discharge to make sure your symptoms are adequately controlled 2. Your hemoglobin on this admission was 6.7 - 6.9; you mentioned you get transfusion if hemoglobin less than 6. You have not received transfusion during this hospital stay. 3. WBC count has trended down from 17 to 11 prior to discharge. 4. Your kidney function is within normal limits during this hospital stay.   Increase activity slowly  As directed        Medication List         diphenhydrAMINE 25 mg capsule  Commonly known as:  BENADRYL  Take 1-2 capsules (25-50 mg total) by mouth every 4 (four) hours as needed for itching.     folic acid 1 MG tablet  Commonly known as:  FOLVITE  Take 1 mg by mouth daily.     HYDROmorphone 4 MG tablet  Commonly known as:  DILAUDID  Take 1 tablet (4 mg total) by mouth every 4 (four) hours as needed for pain.     oxyCODONE 15 MG immediate release tablet  Commonly known as:  ROXICODONE  Take 1 tablet (15 mg total) by mouth every 6 (six) hours as needed for pain.     valACYclovir 500 MG tablet  Commonly known as:  VALTREX  Take 500 mg by mouth 2 (two) times daily as needed. For outbreak           Follow-up Information   Schedule an appointment as soon as possible for a visit with [name], MD.   Specialty:  Oncology   Contact information:   [address] [place] [address] [phone]        The results of significant diagnostics from this hospitalization (including imaging, microbiology, ancillary and laboratory) are listed below for reference.    Significant Diagnostic Studies: Dg Chest 2 View  11/11/2012   *RADIOLOGY REPORT*  Clinical Data: Sickle cell pain  CHEST - 2 VIEW  Comparison:  05/06/2012  Findings: There is mild to moderate enlargement of the cardiac silhouette, stable.  The mediastinum is normal in contour caliber. No hilar masses are noted.  There are subtle perihilar and lower lung zone ill-defined airspace opacities and central vascular congestion.  No pleural effusion or pneumothorax.  IMPRESSION: Stable cardiac enlargement.  Subtle airspace lung opacities are new, which, along with central vascular congestion, suggests mild pulmonary edema.   Original Report Authenticated By: Amie Portland, M.D.    Microbiology: Recent Results (from the past 240 hour(s))  CULTURE, BLOOD (ROUTINE X 2)     Status: None   Collection Time    11/11/12  9:45 AM      Result Value Range Status   Specimen Description BLOOD LEFT ARM  3 ML IN [facility] BOTTLE   Final   Special Requests NONE   Final   Culture  Setup Time     Final   Value: 11/11/2012 15:09     Performed at [facility]   Culture     Final   Value:        BLOOD CULTURE RECEIVED NO GROWTH TO DATE CULTURE WILL BE HELD FOR 5 DAYS BEFORE ISSUING A FINAL NEGATIVE REPORT     Performed at [facility]   Report Status PENDING   Incomplete  CULTURE, BLOOD (ROUTINE X 2)     Status: None   Collection Time    11/11/12  9:53 AM      Result Value Range Status   Specimen Description BLOOD LEFT ARM  4 ML IN [facility] BOTTLE   Final   Special Requests NONE   Final   Culture  Setup Time     Final   Value: 11/11/2012 15:08     Performed at [facility]   Culture     Final   Value:        BLOOD CULTURE RECEIVED NO GROWTH TO DATE CULTURE WILL BE HELD FOR 5 DAYS BEFORE ISSUING A FINAL NEGATIVE REPORT     Performed at [facility]   Report Status PENDING   Incomplete     Labs: Basic Metabolic Panel:  Recent Labs Lab 11/11/12 0825 11/12/12 0405  NA 136 138  K 3.8 3.8  CL 103 107  CO2 22 25  GLUCOSE 90 93  BUN 6 6  CREATININE 0.44* 0.46*  CALCIUM 8.7 9.1   Liver Function Tests:  Recent Labs Lab  11/12/12 0405  AST 49*  ALT 15  ALKPHOS 62  BILITOT 3.3*  PROT 7.1  ALBUMIN 3.5   No results found for this basename: LIPASE, AMYLASE,  in the last 168 hours No results found for this basename: AMMONIA,  in the last 168 hours CBC:  Recent Labs Lab 11/11/12 0825 11/12/12 0405 11/12/12 0807  WBC 17.6* 10.7* 9.9  NEUTROABS 13.0*  --   --   HGB 6.9* 6.7* 6.5*  HCT 18.1* 18.3* 17.8*  MCV 91.0 91.0 90.8  PLT 337 353 354   Cardiac Enzymes: No results found for this basename: CKTOTAL, CKMB, CKMBINDEX, TROPONINI,  in the last 168 hours BNP: BNP (last 3 results) No results found for this basename: PROBNP,  in the last 8760 hours CBG: No results found for this basename: GLUCAP,  in the last 168 hours  Time coordinating discharge: Over 30 minutes  Signed:  Manson Passey, MD  [facility]  11/12/2012, 11:13 AM  Pager #: [phone]

## 2012-11-12 NOTE — Progress Notes (Signed)
Pt. Was discharged home she was given her discharge instructions, prescriptions, and all questions were answered. She was then transported home by family.

## 2012-11-17 LAB — CULTURE, BLOOD (ROUTINE X 2): Culture: NO GROWTH

## 2013-01-03 ENCOUNTER — Ambulatory Visit: Payer: Medicaid Other | Admitting: Hematology & Oncology

## 2013-01-03 ENCOUNTER — Other Ambulatory Visit: Payer: Medicaid Other | Admitting: Lab

## 2013-02-08 ENCOUNTER — Emergency Department (HOSPITAL_COMMUNITY): Payer: Medicaid Other

## 2013-02-08 ENCOUNTER — Inpatient Hospital Stay (HOSPITAL_COMMUNITY)
Admission: EM | Admit: 2013-02-08 | Discharge: 2013-02-11 | DRG: 189 | Discharge status: Against Medical Advice | Payer: Medicaid Other | Attending: Internal Medicine | Admitting: Internal Medicine

## 2013-02-08 ENCOUNTER — Encounter (HOSPITAL_COMMUNITY): Payer: Self-pay | Admitting: Emergency Medicine

## 2013-02-08 DIAGNOSIS — J9600 Acute respiratory failure, unspecified whether with hypoxia or hypercapnia: Principal | ICD-10-CM | POA: Diagnosis present

## 2013-02-08 DIAGNOSIS — Z833 Family history of diabetes mellitus: Secondary | ICD-10-CM

## 2013-02-08 DIAGNOSIS — Z800 Family history of malignant neoplasm of digestive organs: Secondary | ICD-10-CM

## 2013-02-08 DIAGNOSIS — D5700 Hb-SS disease with crisis, unspecified: Secondary | ICD-10-CM | POA: Diagnosis present

## 2013-02-08 DIAGNOSIS — Z79899 Other long term (current) drug therapy: Secondary | ICD-10-CM

## 2013-02-08 DIAGNOSIS — R0902 Hypoxemia: Secondary | ICD-10-CM

## 2013-02-08 DIAGNOSIS — Z803 Family history of malignant neoplasm of breast: Secondary | ICD-10-CM

## 2013-02-08 DIAGNOSIS — D649 Anemia, unspecified: Secondary | ICD-10-CM

## 2013-02-08 DIAGNOSIS — D638 Anemia in other chronic diseases classified elsewhere: Secondary | ICD-10-CM

## 2013-02-08 DIAGNOSIS — M329 Systemic lupus erythematosus, unspecified: Secondary | ICD-10-CM

## 2013-02-08 DIAGNOSIS — R509 Fever, unspecified: Secondary | ICD-10-CM

## 2013-02-08 DIAGNOSIS — R0989 Other specified symptoms and signs involving the circulatory and respiratory systems: Secondary | ICD-10-CM | POA: Diagnosis present

## 2013-02-08 DIAGNOSIS — J9601 Acute respiratory failure with hypoxia: Secondary | ICD-10-CM

## 2013-02-08 DIAGNOSIS — D72829 Elevated white blood cell count, unspecified: Secondary | ICD-10-CM | POA: Diagnosis present

## 2013-02-08 LAB — BASIC METABOLIC PANEL
BUN: 6 mg/dL (ref 6–23)
CO2: 23 mEq/L (ref 19–32)
Chloride: 103 mEq/L (ref 96–112)
GFR calc non Af Amer: >90 mL/min (ref >90)
Glucose, Bld: 103 mg/dL — ABNORMAL HIGH (ref 70–99)
Potassium: 3.7 mEq/L (ref 3.5–5.1)
Sodium: 136 mEq/L (ref 135–145)

## 2013-02-08 LAB — CBC WITH DIFFERENTIAL/PLATELET
Basophils Relative: 0 % (ref 0–1)
Eosinophils Absolute: 0.2 10*3/uL (ref 0.0–0.7)
Eosinophils Relative: 1 % (ref 0–5)
HCT: 16.6 % — ABNORMAL LOW (ref 36.0–46.0)
Hemoglobin: 6.2 g/dL — CL (ref 12.0–15.0)
Lymphocytes Relative: 19 % (ref 12–46)
MCHC: 38 g/dL — ABNORMAL HIGH (ref 30.0–36.0)
Neutro Abs: 15.6 10*3/uL — ABNORMAL HIGH (ref 1.7–7.7)
Neutrophils Relative %: 74 % (ref 43–77)
RBC: 1.9 MIL/uL — ABNORMAL LOW (ref 3.87–5.11)

## 2013-02-08 LAB — C-REACTIVE PROTEIN: CRP: 0.6 mg/dL — ABNORMAL HIGH (ref <0.60)

## 2013-02-08 LAB — RETICULOCYTES
RBC.: 1.9 MIL/uL — ABNORMAL LOW (ref 3.87–5.11)
Retic Count, Absolute: 579.5 10*3/uL — ABNORMAL HIGH (ref 19.0–186.0)

## 2013-02-08 LAB — POCT I-STAT TROPONIN I: Troponin i, poc: 0 ng/mL (ref 0.00–0.08)

## 2013-02-08 MED ORDER — DIPHENHYDRAMINE HCL 50 MG/ML IJ SOLN
12.5000 mg | Freq: Once | INTRAMUSCULAR | Status: AC
  Administered 2013-02-08: 12.5 mg via INTRAVENOUS
  Filled 2013-02-08: qty 1

## 2013-02-08 MED ORDER — OXYCODONE HCL 5 MG PO TABS
5.0000 mg | ORAL_TABLET | Freq: Once | ORAL | Status: AC
  Administered 2013-02-08: 5 mg via ORAL
  Filled 2013-02-08: qty 1

## 2013-02-08 MED ORDER — HYDROMORPHONE HCL PF 2 MG/ML IJ SOLN
2.0000 mg | Freq: Once | INTRAMUSCULAR | Status: AC
  Administered 2013-02-08: 2 mg via INTRAVENOUS
  Filled 2013-02-08: qty 1

## 2013-02-08 MED ORDER — ONDANSETRON HCL 4 MG/2ML IJ SOLN: 4.0000 mg | Freq: Four times a day (QID) | INTRAMUSCULAR | Status: DC | PRN

## 2013-02-08 MED ORDER — SENNOSIDES-DOCUSATE SODIUM 8.6-50 MG PO TABS: 1.0000 | ORAL_TABLET | Freq: Every evening | ORAL | Status: DC | PRN

## 2013-02-08 MED ORDER — NORGESTIMATE-ETH ESTRADIOL 0.25-35 MG-MCG PO TABS: 1.0000 | ORAL_TABLET | Freq: Every day | ORAL | Status: DC

## 2013-02-08 MED ORDER — ONDANSETRON HCL 4 MG PO TABS: 4.0000 mg | ORAL_TABLET | Freq: Four times a day (QID) | ORAL | Status: DC | PRN

## 2013-02-08 MED ORDER — NORGESTIMATE-ETH ESTRADIOL 0.25-35 MG-MCG PO TABS
1.0000 | ORAL_TABLET | Freq: Every day | ORAL | Status: DC
  Administered 2013-02-10 – 2013-02-11 (×2): 1 via ORAL

## 2013-02-08 MED ORDER — ACETAMINOPHEN 325 MG PO TABS
650.0000 mg | ORAL_TABLET | Freq: Four times a day (QID) | ORAL | Status: DC | PRN
  Administered 2013-02-08 – 2013-02-09 (×2): 650 mg via ORAL
  Filled 2013-02-08 (×3): qty 2

## 2013-02-08 MED ORDER — SODIUM CHLORIDE 0.9 % IV BOLUS (SEPSIS)
1000.0000 mL | Freq: Once | INTRAVENOUS | Status: AC
  Administered 2013-02-08: 1000 mL via INTRAVENOUS

## 2013-02-08 MED ORDER — FUROSEMIDE 10 MG/ML IJ SOLN
20.0000 mg | Freq: Once | INTRAMUSCULAR | Status: AC
  Administered 2013-02-08: 20 mg via INTRAVENOUS
  Filled 2013-02-08: qty 2

## 2013-02-08 MED ORDER — HYDROMORPHONE HCL 4 MG PO TABS
4.0000 mg | ORAL_TABLET | ORAL | Status: DC | PRN
  Administered 2013-02-09 – 2013-02-11 (×3): 4 mg via ORAL
  Filled 2013-02-08 (×3): qty 1

## 2013-02-08 MED ORDER — TRAMADOL HCL 50 MG PO TABS
50.0000 mg | ORAL_TABLET | Freq: Four times a day (QID) | ORAL | Status: DC | PRN
  Administered 2013-02-08 – 2013-02-09 (×2): 50 mg via ORAL
  Filled 2013-02-08 (×2): qty 1

## 2013-02-08 MED ORDER — SODIUM CHLORIDE 0.9 % IJ SOLN: 3.0000 mL | INTRAMUSCULAR | Status: DC | PRN

## 2013-02-08 MED ORDER — SODIUM CHLORIDE 0.9 % IJ SOLN
3.0000 mL | Freq: Two times a day (BID) | INTRAMUSCULAR | Status: DC
  Administered 2013-02-08 – 2013-02-11 (×5): 3 mL via INTRAVENOUS

## 2013-02-08 MED ORDER — SODIUM CHLORIDE 0.9 % IV SOLN: 250.0000 mL | INTRAVENOUS | Status: DC | PRN

## 2013-02-08 MED ORDER — FOLIC ACID 1 MG PO TABS
1.0000 mg | ORAL_TABLET | Freq: Every day | ORAL | Status: DC
  Administered 2013-02-08 – 2013-02-11 (×4): 1 mg via ORAL
  Filled 2013-02-08 (×4): qty 1

## 2013-02-08 MED ORDER — HYDROMORPHONE HCL PF 1 MG/ML IJ SOLN: 1.0000 mg | Freq: Once | INTRAMUSCULAR | Status: DC

## 2013-02-08 MED ORDER — ACETAMINOPHEN 650 MG RE SUPP: 650.0000 mg | Freq: Four times a day (QID) | RECTAL | Status: DC | PRN

## 2013-02-08 MED ORDER — ENOXAPARIN SODIUM 40 MG/0.4ML ~~LOC~~ SOLN
40.0000 mg | SUBCUTANEOUS | Status: DC
  Administered 2013-02-09 – 2013-02-10 (×2): 40 mg via SUBCUTANEOUS
  Filled 2013-02-08 (×4): qty 0.4

## 2013-02-08 MED ORDER — ONDANSETRON HCL 4 MG/2ML IJ SOLN
4.0000 mg | Freq: Once | INTRAMUSCULAR | Status: AC
Start: 2013-02-08 — End: 2013-02-08
  Administered 2013-02-08: 4 mg via INTRAVENOUS
  Filled 2013-02-08: qty 2

## 2013-02-08 MED ORDER — DIPHENHYDRAMINE HCL 25 MG PO CAPS
25.0000 mg | ORAL_CAPSULE | ORAL | Status: DC | PRN
  Administered 2013-02-09 – 2013-02-11 (×3): 25 mg via ORAL
  Filled 2013-02-08 (×2): qty 1

## 2013-02-08 MED ORDER — KETOROLAC TROMETHAMINE 30 MG/ML IJ SOLN
30.0000 mg | Freq: Once | INTRAMUSCULAR | Status: AC
  Administered 2013-02-08: 30 mg via INTRAVENOUS
  Filled 2013-02-08: qty 1

## 2013-02-08 NOTE — ED Provider Notes (Signed)
CSN: [id number]     Arrival date & time 02/08/13  0325 History   First MD Initiated Contact with Patient 02/08/13 0350     Chief Complaint  Patient presents with  . Sickle Cell Pain Crisis   (Consider location/radiation/quality/duration/timing/severity/associated sxs/prior Treatment) HPI Comments: Patient is sickle cell patient of Dr. Myna Hidalgo who presents tonight with acute pain crisis.  She reports that her pain has been bilateral elbows, hips, knees and chest and is similar to her usual pain crisises.  She states that she has been trying her home medication without relief of the pain.  She denies fever, chills, reports a past history of acute chest.  Her sickle cell disease is compounded by the fact that she also has lupus.  She denies history of DVT or PE in the past.  She reports that her HGB is usually 6.5 and she is not usually transfused until she gets to 6.0.  She reports a mild increase in stress at this time but no recent infection, though she does feel like she is dehydrated.  Patient is a 22 y.o. female presenting with sickle cell pain. The history is provided by the patient. No language interpreter was used.  Sickle Cell Pain Crisis Location:  Hip, upper extremity, chest and back Severity:  Severe Onset quality:  Gradual Duration:  3 days Similar to previous crisis episodes: yes   Timing:  Constant Progression:  Worsening Chronicity:  Recurrent Sickle cell genotype:  SS Usual hemoglobin level:  6.5 Date of last transfusion:  1 year ago Frequency of attacks:  Yearly History of pulmonary emboli: no   Context: cold exposure, dehydration and stress   Context: not change in medication, not infection, not non-compliance, not menses and not pregnancy   Relieved by:  Nothing Worsened by:  Activity Ineffective treatments:  Prescription drugs Associated symptoms: chest pain and fatigue   Associated symptoms: no congestion, no cough, no fever, no headaches, no nausea, no shortness  of breath, no sore throat, no swelling of legs, no vomiting and no wheezing   Risk factors: prior acute chest   Risk factors: no frequent admissions for pain and no frequent pain crises     Past Medical History  Diagnosis Date  . Sickle cell anemia   . 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 Lupus   . Heart murmur   . Sickle cell anemia 05/06/2012   Past Surgical History  Procedure Laterality Date  . Cholecystectomy     Family History  Problem Relation Age of Onset  . Sickle cell trait Mother   . Sickle cell trait Father   . Breast cancer Maternal Aunt     X 2  . Cancer Maternal Grandfather     Pancreatic Cancer  . Diabetes Maternal Aunt    History  Substance Use Topics  . Smoking status: Never Smoker   . Smokeless tobacco: Not on file  . Alcohol Use: No   OB History   Grav Para Term Preterm Abortions TAB SAB Ect Mult Living                 Review of Systems  Constitutional: Positive for fatigue. Negative for fever.  HENT: Negative for congestion and sore throat.   Respiratory: Negative for cough, shortness of breath and wheezing.   Cardiovascular: Positive for chest pain.  Gastrointestinal: Negative for nausea and vomiting.  Neurological: Negative for headaches.  All other systems reviewed and are negative.    Allergies  Ibuprofen  Home Medications   Current Outpatient Rx  Name  Route  Sig  Dispense  Refill  . diphenhydrAMINE (BENADRYL) 25 mg capsule   Oral   Take 1-2 capsules (25-50 mg total) by mouth every 4 (four) hours as needed for itching.   60 capsule   0   . folic acid (FOLVITE) 1 MG tablet   Oral   Take 1 mg by mouth daily.         Marland Kitchen HYDROmorphone (DILAUDID) 4 MG tablet   Oral   Take 1 tablet (4 mg total) by mouth every 4 (four) hours as needed for pain.   60 tablet   0   . norgestimate-ethinyl estradiol (ORTHO-CYCLEN,SPRINTEC,PREVIFEM) 0.25-35 MG-MCG tablet   Oral   Take 1 tablet by mouth daily.         Marland Kitchen oxyCODONE  (ROXICODONE) 15 MG immediate release tablet   Oral   Take 1 tablet (15 mg total) by mouth every 6 (six) hours as needed for pain.   60 tablet   0   . valACYclovir (VALTREX) 500 MG tablet   Oral   Take 500 mg by mouth 2 (two) times daily as needed. For outbreak          BP 122/65  Pulse 84  Temp(Src) 97.8 F (36.6 C) (Oral)  Resp 21  SpO2 94%  LMP 02/08/2013 Physical Exam  Nursing note and vitals reviewed. Constitutional: She is oriented to person, place, and time. She appears well-developed and well-nourished. No distress.  HENT:  Head: Normocephalic and atraumatic.  Right Ear: External ear normal.  Left Ear: External ear normal.  Nose: Nose normal.  Mouth/Throat: Oropharynx is clear and moist. No oropharyngeal exudate.  Eyes: Pupils are equal, round, and reactive to light. Scleral icterus is present.  Pale conjunctiva  Neck: Normal range of motion. Neck supple.  Cardiovascular: Normal rate, regular rhythm and normal heart sounds.  Exam reveals no gallop and no friction rub.   No murmur heard. Pulmonary/Chest: Effort normal and breath sounds normal. No respiratory distress. She has no wheezes. She has no rales. She exhibits no tenderness.  Abdominal: Soft. Bowel sounds are normal. She exhibits no distension. There is no tenderness. There is no rebound and no guarding.  Musculoskeletal: She exhibits tenderness. She exhibits no edema.  Diffuse arthralgia to elbows, knees, and hips.  No erythema, FROM.  Lymphadenopathy:    She has no cervical adenopathy.  Neurological: She is alert and oriented to person, place, and time. She exhibits normal muscle tone. Coordination normal.  Skin: Skin is warm and dry. No rash noted. No erythema. No pallor.  Psychiatric: She has a normal mood and affect. Her behavior is normal. Judgment and thought content normal.    ED Course  Procedures (including critical care time) Labs Review Labs Reviewed  BASIC METABOLIC PANEL - Abnormal; Notable  for the following:    Glucose, Bld 103 (*)    Creatinine, Ser 0.48 (*)    All other components within normal limits  CBC WITH DIFFERENTIAL  RETICULOCYTES  POCT I-STAT TROPONIN I   Imaging Review Dg Chest 2 View  02/08/2013   CLINICAL DATA:  Sickle cell crisis  EXAM: CHEST  2 VIEW  COMPARISON:  Radiograph from 11/11/2012  FINDINGS: Cardiomegaly is stable as compared to the prior exam, consistent with known history with sickle cell disease.  Lungs are normally inflated. No focal infiltrate identified. There is mild perihilar vascular congestion without pulmonary edema. No pneumothorax or pleural effusion.  The visualized osseous structures are within normal limits.  IMPRESSION: Stable cardiomegaly with mild perihilar vascular congestion. No overt pulmonary edema or focal infiltrate identified.   Electronically Signed   By: Rise Mu M.D.   On: 02/08/2013 04:38    EKG Interpretation   None       Date: 02/08/2013  Rate: 79  Rhythm: sinus  QRS Axis: 75  Intervals: PR 173, QT 395, QTc 453  ST/T Wave abnormalities: none  Conduction Disutrbances:none  Narrative Interpretation: LVH  Old EKG Reviewed: no acute changes    MDM  Sickle Cell Pain Leukocytosis  Patient with history of sickle cell SS presents with 3 day history of pain.  She reports that she usually doesn't get transfused until she reachese 6.0 HGB and she is close to her baseline at 6.2.  She is noted with leukocytosis but commonly always has this with her crisis.  She had tried her home pain medication wtihout relief however after two doses of dilaudid 2mg  IV she reports marked relief in pain and is requesting to return home.  She will follow up with Dr. Myna Hidalgo and has been given strict return precautions.  I have spoken with Dr. Ranae Palms as well in regards to this patient.   Izola Price Marisue Humble, [place] 02/08/13 [phone]

## 2013-02-08 NOTE — ED Notes (Signed)
EMS called to home. Found patient sitting on bed with complaints of sickle cell pain that she has had over  The last 3 days but has increased over the last 4 hours.

## 2013-02-08 NOTE — ED Notes (Signed)
EKG given to EDP, Yelverton, MD. 

## 2013-02-08 NOTE — ED Notes (Signed)
EDP, Yelverton at bedside request pt placed on nonrebreather.

## 2013-02-08 NOTE — ED Notes (Signed)
Pt placed on 2 lpm O[place]. 

## 2013-02-08 NOTE — Care Management Note (Signed)
   CARE MANAGEMENT NOTE 02/08/2013  Patient:  CARMALETA, YOUNGERS   Account Number:  [id number]  Date Initiated:  02/08/2013  Documentation initiated by:  Reda Gettis  Subjective/Objective Assessment:   22 yo female admitted with hypoxic respiratory failure. Hx: Sickle Cell Anemia.     Action/Plan:   Home when stable   Anticipated DC Date:  02/08/2013   Anticipated DC Plan:  HOME/SELF CARE      DC Planning Services  CM consult      Choice offered to / List presented to:  NA   DME arranged  NA      DME agency  NA     HH arranged  NA      HH agency  NA   Status of service:  Completed, signed off Medicare Important Message given?   (If response is "NO", the following Medicare IM given date fields will be blank) Date Medicare IM given:   Date Additional Medicare IM given:    Discharge Disposition:    Per UR Regulation:  Reviewed for med. necessity/level of care/duration of stay  If discussed at [place] Length of Stay Meetings, dates discussed:    Comments:  02/08/13 1240 Compton Brigance,RN,MSN [phone] Chart reviewed for utilization of services. Cm spoke with patient at the bedside. Per pt PCP is Dr. Rubye Oaks in [place], Hematologist is Dr.Peter Myna Hidalgo. Pt attends college here in [place]. No other needs identified at this time.

## 2013-02-08 NOTE — ED Provider Notes (Signed)
Patient was discharged to go home but became hypoxic into the 70s with a good waveform. Patient was drowsy but arousable. She had increased crackles in her lung bases. She is placed on nonrebreather with improvement of her oxygen saturations to 100%. Repeat chest x-ray shows increased vascular congestion. Discussed with Dr. Lily Peer. She will admit the patient for observation.  Loren Racer, MD 02/08/13 [phone]

## 2013-02-08 NOTE — H&P (Signed)
[facility]          History and Physical    PCP:   No primary provider on file.   Chief Complaint:  Upper extremity pain, hypoxemia  HPI: Patient is a 22 y/o woman with h/o sickle cell disease and SLE, who presented to the ED with upper extremity pain that is typical for her SS crises. She received IV dilaudid and her pain improved to the point where she wanted to be discharged home. EDP was in the process of discharging her, when she became drowsy and her sats dropped into th 70s requiring a NRB. CXR shows some pulmonary vascular congestion. We were asked to admit her for further evaluation and management. She is very drowsy and is unable to participate in conversation as she quickly doses off. Her sister is present at bedside and provides most of the information. By the time I see her, she is on 2 L [place] satting at 97%.  Allergies:   Allergies  Allergen Reactions  . Ibuprofen Other (See Comments)    Due to other medications she takes for Lupus      Past Medical History  Diagnosis Date  . Sickle cell anemia   . 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 Lupus   . Heart murmur   . Sickle cell anemia 05/06/2012    Past Surgical History  Procedure Laterality Date  . Cholecystectomy      Prior to Admission medications   Medication Sig Start Date End Date Taking? Authorizing Provider  diphenhydrAMINE (BENADRYL) 25 mg capsule Take 1-2 capsules (25-50 mg total) by mouth every 4 (four) hours as needed for itching. 11/12/12  Yes Alison Murray, MD  folic acid (FOLVITE) 1 MG tablet Take 1 mg by mouth daily.   Yes Historical Provider, MD  HYDROmorphone (DILAUDID) 4 MG tablet Take 1 tablet (4 mg total) by mouth every 4 (four) hours as needed for pain. 11/12/12  Yes Alison Murray, MD  norgestimate-ethinyl estradiol (ORTHO-CYCLEN,SPRINTEC,PREVIFEM) 0.25-35 MG-MCG tablet Take 1 tablet by mouth daily.   Yes Historical Provider, MD  oxyCODONE (ROXICODONE) 15 MG immediate  release tablet Take 1 tablet (15 mg total) by mouth every 6 (six) hours as needed for pain. 11/12/12  Yes Alison Murray, MD  valACYclovir (VALTREX) 500 MG tablet Take 500 mg by mouth 2 (two) times daily as needed. For outbreak   Yes Historical Provider, MD    Social History:  reports that she has never smoked. She does not have any smokeless tobacco history on file. She reports that she does not drink alcohol or use illicit drugs.  Family History  Problem Relation Age of Onset  . Sickle cell trait Mother   . Sickle cell trait Father   . Breast cancer Maternal Aunt     X 2  . Cancer Maternal Grandfather     Pancreatic Cancer  . Diabetes Maternal Aunt     Review of Systems:  Unable to obtain given current level of sedation.  Physical Exam: Blood pressure 104/61, pulse 99, temperature 97.8 F (36.6 C), temperature source Oral, resp. rate 16, last menstrual period 02/08/2013, SpO2 97.00%. GEN; Drowsy HEENT: [place]/AT/PERRL Neck: supple, no JVD, no LAD, no bruits, no goiter CV: RRR, no M/R?G Lungs: CTA B Abd: S/NT/ND/+BS Ext: no C/C/E Neuro: non-focal.  Labs on Admission:  Results for orders placed during the hospital encounter of 02/08/13 (from the past 48 hour(s))  CBC WITH DIFFERENTIAL     Status: Abnormal  Collection Time    02/08/13  4:00 AM      Result Value Range   WBC 21.1 (*) 4.0 - 10.5 K/uL   Comment: WHITE COUNT CONFIRMED ON SMEAR   RBC 1.90 (*) 3.87 - 5.11 MIL/uL   Hemoglobin 6.2 (*) 12.0 - 15.0 g/dL   Comment: REPEATED TO VERIFY     CRITICAL RESULT CALLED TO, READ BACK BY AND VERIFIED WITH:     LEONARD,T RN @0504  ON 11.21.2014 BY MCREYNOLDS,B   HCT 16.6 (*) 36.0 - 46.0 %   MCV 87.4  78.0 - 100.0 fL   MCH 32.6  26.0 - 34.0 pg   MCHC 38.0 (*) 30.0 - 36.0 g/dL   Comment: SICKLE CELLS   RDW 22.1 (*) 11.5 - 15.5 %   Platelets 216  150 - 400 K/uL   Comment: GIANT PLATELETS SEEN     PLATELET COUNT CONFIRMED BY SMEAR   Neutrophils Relative % 74  43 - 77 %    Lymphocytes Relative 19  12 - 46 %   Monocytes Relative 6  3 - 12 %   Eosinophils Relative 1  0 - 5 %   Basophils Relative 0  0 - 1 %   Neutro Abs 15.6 (*) 1.7 - 7.7 K/uL   Lymphs Abs 4.0  0.7 - 4.0 K/uL   Monocytes Absolute 1.3 (*) 0.1 - 1.0 K/uL   Eosinophils Absolute 0.2  0.0 - 0.7 K/uL   Basophils Absolute 0.0  0.0 - 0.1 K/uL   RBC Morphology POLYCHROMASIA PRESENT     Comment: TARGET CELLS     SICKLE CELLS  BASIC METABOLIC PANEL     Status: Abnormal   Collection Time    02/08/13  4:00 AM      Result Value Range   Sodium 136  135 - 145 mEq/L   Potassium 3.7  3.5 - 5.1 mEq/L   Chloride 103  96 - 112 mEq/L   CO2 23  19 - 32 mEq/L   Glucose, Bld 103 (*) 70 - 99 mg/dL   BUN 6  6 - 23 mg/dL   Creatinine, Ser 1.47 (*) 0.50 - 1.10 mg/dL   Calcium 9.0  8.4 - 82.9 mg/dL   GFR calc non Af Amer >90  >90 mL/min   GFR calc Af Amer >90  >90 mL/min   Comment: (NOTE)     The eGFR has been calculated using the CKD EPI equation.     This calculation has not been validated in all clinical situations.     eGFR's persistently <90 mL/min signify possible Chronic Kidney     Disease.  RETICULOCYTES     Status: Abnormal   Collection Time    02/08/13  4:00 AM      Result Value Range   Retic Ct Pct 30.5 (*) 0.4 - 3.1 %   Comment: RESULTS CONFIRMED BY MANUAL DILUTION   RBC. 1.90 (*) 3.87 - 5.11 MIL/uL   Retic Count, Manual 579.5 (*) 19.0 - 186.0 K/uL  SEDIMENTATION RATE     Status: Abnormal   Collection Time    02/08/13  4:00 AM      Result Value Range   Sed Rate 35 (*) 0 - 22 mm/hr  POCT I-STAT TROPONIN I     Status: None   Collection Time    02/08/13  4:31 AM      Result Value Range   Troponin i, poc 0.00  0.00 - 0.08 ng/mL   Comment 3  Comment: Due to the release kinetics of cTnI,     a negative result within the first hours     of the onset of symptoms does not rule out     myocardial infarction with certainty.     If myocardial infarction is still suspected,     repeat  the test at appropriate intervals.    Radiological Exams on Admission: Dg Chest 2 View  02/08/2013   CLINICAL DATA:  Sickle cell crisis  EXAM: CHEST  2 VIEW  COMPARISON:  Radiograph from 11/11/2012  FINDINGS: Cardiomegaly is stable as compared to the prior exam, consistent with known history with sickle cell disease.  Lungs are normally inflated. No focal infiltrate identified. There is mild perihilar vascular congestion without pulmonary edema. No pneumothorax or pleural effusion.  The visualized osseous structures are within normal limits.  IMPRESSION: Stable cardiomegaly with mild perihilar vascular congestion. No overt pulmonary edema or focal infiltrate identified.   Electronically Signed   By: Rise Mu M.D.   On: 02/08/2013 04:38   Dg Chest Port 1 View  02/08/2013   CLINICAL DATA:  Hypoxia.  Sickle Cell.  EXAM: PORTABLE CHEST - 1 VIEW 7:53 a.m.  COMPARISON:  02/08/2013 at 4:28 a.m. and 11/11/2012  FINDINGS: There is interval slight increase in the pulmonary vascular congestion. Main pulmonary artery and as chronically prominent. No effusions or pulmonary edema.  No acute osseous abnormality.  IMPRESSION: Increased pulmonary vascular congestion.   Electronically Signed   By: Geanie Cooley M.D.   On: 02/08/2013 08:00    Assessment/Plan Principal Problem:   Acute respiratory failure with hypoxia Active Problems:   Sickle cell pain crisis   Leukocytosis, unspecified   Pulmonary vascular congestion   Anemia, chronic disease   Acute Hypoxemic Respiratory Failure -Likely 2/2 overuse of IV narcotics plus possibly too aggressive fluid resuscitation with pulmonary vascular congestion. -Now that she is on [place], will admit to med surg bed as an observation to monitor respiratory status overnight. -Feel no need to administer narcan as she is satting ok on [place]. -Will also give 20 mg IV lasix to help with vascular congestion seen on CXR.  Sickle Cell Pain Crises -Resolved with narcotics  given in the ED. -NO IV NARCOTICS for now. -Will continue home PO dilaudid, once she is more alert. -Hb is 6.2 which is her baseline; no need for transfusion at this point.  Leukocytosis -likely related to acute SSC. -Follow trend.  DVT Prophylaxis -Lovenox  Code Status -Full code.   Time Spent on Admission: 70 minutes  HERNANDEZ ACOSTA,ESTELA [facility] Pager: [phone] 02/08/2013, 9:18 AM

## 2013-02-08 NOTE — ED Notes (Signed)
After vitals taking tracking O2 sat of 63-65%. Pt placed on 2 lpm O2 resulting in O2 sat of 89%. EDP, [place] alerted.

## 2013-02-09 ENCOUNTER — Observation Stay (HOSPITAL_COMMUNITY): Payer: Medicaid Other

## 2013-02-09 DIAGNOSIS — R509 Fever, unspecified: Secondary | ICD-10-CM

## 2013-02-09 LAB — BASIC METABOLIC PANEL
BUN: 5 mg/dL — ABNORMAL LOW (ref 6–23)
CO2: 24 mEq/L (ref 19–32)
Chloride: 101 mEq/L (ref 96–112)
Creatinine, Ser: 0.54 mg/dL (ref 0.50–1.10)
GFR calc Af Amer: >90 mL/min (ref >90)
Glucose, Bld: 84 mg/dL (ref 70–99)
Potassium: 4.6 mEq/L (ref 3.5–5.1)

## 2013-02-09 LAB — URINALYSIS, ROUTINE W REFLEX MICROSCOPIC
Leukocytes, UA: NEGATIVE
Nitrite: NEGATIVE
Specific Gravity, Urine: 1.009 (ref 1.005–1.030)
Urobilinogen, UA: 1 mg/dL (ref 0.0–1.0)
pH: 6.5 (ref 5.0–8.0)

## 2013-02-09 LAB — CBC
HCT: 15.8 % — ABNORMAL LOW (ref 36.0–46.0)
Hemoglobin: 6 g/dL — CL (ref 12.0–15.0)
MCH: 32.4 pg (ref 26.0–34.0)
MCHC: 38 g/dL — ABNORMAL HIGH (ref 30.0–36.0)
MCV: 85.4 fL (ref 78.0–100.0)
RDW: 24.8 % — ABNORMAL HIGH (ref 11.5–15.5)
WBC: 16.7 10*3/uL — ABNORMAL HIGH (ref 4.0–10.5)

## 2013-02-09 LAB — URINE MICROSCOPIC-ADD ON

## 2013-02-09 LAB — PREGNANCY, URINE: Preg Test, Ur: NEGATIVE

## 2013-02-09 MED ORDER — IOHEXOL 350 MG/ML SOLN
100.0000 mL | Freq: Once | INTRAVENOUS | Status: AC | PRN
  Administered 2013-02-09: 100 mL via INTRAVENOUS

## 2013-02-09 MED ORDER — SUMATRIPTAN SUCCINATE 50 MG PO TABS
50.0000 mg | ORAL_TABLET | ORAL | Status: DC | PRN
  Administered 2013-02-09: 50 mg via ORAL
  Filled 2013-02-09 (×2): qty 1

## 2013-02-09 NOTE — ED Provider Notes (Signed)
Medical screening examination/treatment/procedure(s) were conducted as a shared visit with non-physician practitioner(s) and myself.  I personally evaluated the patient during the encounter   Loren Racer, MD 02/09/13 [id number]

## 2013-02-09 NOTE — Progress Notes (Signed)
Patient c/o headache, Dr. Philip Aspen notified, order received. Also made aware of CT result,encouraged patient to use incentive spirometer and educated re; importance of it, patient verbalized understanding.- Hulda Marin RN

## 2013-02-09 NOTE — Progress Notes (Signed)
[facility] PROGRESS NOTE  [name] ZOX:[id number] DOB: Aug 04, 1990 DOA: 02/08/2013 PCP: No primary provider on file.  Assessment/Plan: Acute Hypoxic Respiratory Failure -Is still 90% on 2 L today despite being alert and CXR with decreased vascular congestion. -Since no current explanation for her hypoxemia, will get a CT angio chest to r/o PE. -Try to mobilize today and wean oxygen as tolerated.  Fever/Leukocytosis -Check UA, urine cx, blood cx, CT chest.  Code Status: Full Code Family Communication: Patient only  Disposition Plan: home when medically stable.   Consultants:  None   Antibiotics:  None   Subjective: C/o HA.  Objective: Filed Vitals:   02/09/13 0219 02/09/13 0500 02/09/13 0611 02/09/13 1010  BP:  108/40  103/58  Pulse:  85  89  Temp:  100.9 F (38.3 C) 100.3 F (37.9 C) 98.1 F (36.7 C)  TempSrc:  Oral Oral Oral  Resp:  15  16  Height:      Weight:      SpO2: 97% 90%  94%    Intake/Output Summary (Last 24 hours) at 02/09/13 1332 Last data filed at 02/09/13 0950  Gross per 24 hour  Intake    603 ml  Output      0 ml  Net    603 ml   Filed Weights   02/08/13 1015  Weight: 70.308 kg (155 lb)    Exam:   General:  AA Ox3  Cardiovascular: RRR  Respiratory: CTA B  Abdomen: S/NT/ND/+BS  Extremities: no C/C/E   Neurologic:  Non-focal  Data Reviewed: Basic Metabolic Panel:  Recent Labs Lab 02/08/13 0400 02/09/13 0340  NA 136 133*  K 3.7 4.6  CL 103 101  CO2 23 24  GLUCOSE 103* 84  BUN 6 5*  CREATININE 0.48* 0.54  CALCIUM 9.0 8.5   Liver Function Tests: No results found for this basename: AST, ALT, ALKPHOS, BILITOT, PROT, ALBUMIN,  in the last 168 hours No results found for this basename: LIPASE, AMYLASE,  in the last 168 hours No results found for this basename: AMMONIA,  in the last 168 hours CBC:  Recent Labs Lab 02/08/13 0400 02/09/13 0340  WBC 21.1* 16.7*  NEUTROABS 15.6*  --   HGB 6.2* 6.0*  HCT  16.6* 15.8*  MCV 87.4 85.4  PLT 216 261   Cardiac Enzymes: No results found for this basename: CKTOTAL, CKMB, CKMBINDEX, TROPONINI,  in the last 168 hours BNP (last 3 results) No results found for this basename: PROBNP,  in the last 8760 hours CBG: No results found for this basename: GLUCAP,  in the last 168 hours  No results found for this or any previous visit (from the past 240 hour(s)).   Studies: Dg Chest 2 View  02/08/2013   CLINICAL DATA:  Sickle cell crisis  EXAM: CHEST  2 VIEW  COMPARISON:  Radiograph from 11/11/2012  FINDINGS: Cardiomegaly is stable as compared to the prior exam, consistent with known history with sickle cell disease.  Lungs are normally inflated. No focal infiltrate identified. There is mild perihilar vascular congestion without pulmonary edema. No pneumothorax or pleural effusion.  The visualized osseous structures are within normal limits.  IMPRESSION: Stable cardiomegaly with mild perihilar vascular congestion. No overt pulmonary edema or focal infiltrate identified.   Electronically Signed   By: Rise Mu M.D.   On: 02/08/2013 04:38   Dg Chest Port 1 View  02/09/2013   CLINICAL DATA:  Hypoxia.  Sickle cell disease.  EXAM: PORTABLE CHEST -  1 VIEW  COMPARISON:  None.  FINDINGS: There is moderate cardiac enlargement. No pleural effusion or edema identified. Diffuse interstitial stress set diffuse increased interstitial prominence is identified and appears unchanged from previous exam. There is no focal airspace consolidation noted.  IMPRESSION: 1.  No acute findings.  2. Stable cardiac enlargement and interstitial prominence.   Electronically Signed   By: Signa Kell M.D.   On: 02/09/2013 07:48   Dg Chest Port 1 View  02/08/2013   CLINICAL DATA:  Hypoxia.  Sickle Cell.  EXAM: PORTABLE CHEST - 1 VIEW 7:53 a.m.  COMPARISON:  02/08/2013 at 4:28 a.m. and 11/11/2012  FINDINGS: There is interval slight increase in the pulmonary vascular congestion. Main  pulmonary artery and as chronically prominent. No effusions or pulmonary edema.  No acute osseous abnormality.  IMPRESSION: Increased pulmonary vascular congestion.   Electronically Signed   By: Geanie Cooley M.D.   On: 02/08/2013 08:00    Scheduled Meds: . enoxaparin (LOVENOX) injection  40 mg Subcutaneous Q24H  . folic acid  1 mg Oral Daily  . norgestimate-ethinyl estradiol  1 tablet Oral Daily  . sodium chloride  3 mL Intravenous Q12H   Continuous Infusions:   Principal Problem:   Acute respiratory failure with hypoxia Active Problems:   Sickle cell pain crisis   Leukocytosis, unspecified   Pulmonary vascular congestion   Anemia, chronic disease   Fever, unspecified    Time spent: 35 minutes    [name],[name]  [facility] Pager [phone]  If 7PM-7AM, please contact night-coverage at [email or web address], password [facility] 02/09/2013, 1:32 PM  LOS: 1 day

## 2013-02-10 DIAGNOSIS — I369 Nonrheumatic tricuspid valve disorder, unspecified: Secondary | ICD-10-CM

## 2013-02-10 LAB — BLOOD GAS, ARTERIAL
Acid-Base Excess: 2.5 mmol/L — ABNORMAL HIGH (ref 0.0–2.0)
Drawn by: 331471
O2 Saturation: 71.3 %
Patient temperature: 98.6
pH, Arterial: 7.428 (ref 7.350–7.450)
pO2, Arterial: 0 mmHg — CL (ref 80.0–100.0)

## 2013-02-10 LAB — BASIC METABOLIC PANEL
CO2: 26 mEq/L (ref 19–32)
Calcium: 8.8 mg/dL (ref 8.4–10.5)
Creatinine, Ser: 0.5 mg/dL (ref 0.50–1.10)
GFR calc Af Amer: >90 mL/min (ref >90)
GFR calc non Af Amer: >90 mL/min (ref >90)
Glucose, Bld: 91 mg/dL (ref 70–99)
Sodium: 134 mEq/L — ABNORMAL LOW (ref 135–145)

## 2013-02-10 LAB — CBC
MCH: 33.2 pg (ref 26.0–34.0)
MCV: 88 fL (ref 78.0–100.0)
Platelets: 260 10*3/uL (ref 150–400)
RBC: 1.84 MIL/uL — ABNORMAL LOW (ref 3.87–5.11)
RDW: 27 % — ABNORMAL HIGH (ref 11.5–15.5)

## 2013-02-10 LAB — URINE CULTURE

## 2013-02-10 MED ORDER — PREDNISONE 5 MG PO TABS
5.0000 mg | ORAL_TABLET | Freq: Once | ORAL | Status: AC
  Administered 2013-02-10: 5 mg via ORAL
  Filled 2013-02-10: qty 1

## 2013-02-10 NOTE — Progress Notes (Signed)
Rt reported to RN that pt PO2 was so low that it would not read on ABG machine.Sat on ABG was 71.3 on room air. When PO2 want read on machine rt has to put 00.0 as a value per policy.Rt advise RN to place pt back on O2.

## 2013-02-10 NOTE — Progress Notes (Signed)
[facility] PROGRESS NOTE  Trust Crago ZOX:[id number] DOB: [birth year] DOA: 02/08/2013 PCP: No primary provider on file.  Assessment/Plan: Acute Hypoxic Respiratory Failure -Is still in the mid 70s on room air with ambulation. -Have requested ABG that shows a PaO2 of 0 (discussed with respiratory therapy and they tell me that when the PaO2 is below 30 it typically reports as 0) -CT angiography chest is negative particularly for thromboembolic disease but also for pneumonia or effusion. -She must have some sort of shunt physiology to explain her marked hypoxemia.  -Will order a 2-D echocardiogram with bubble study.  -Have asked pulmonology, Dr. Sherene Sires, to see patient in consultation.  Fever/Leukocytosis -On sources of infection are negative. -Patient does not have any further fevers. -Leukocytosis is trending downwards.  History of sickle cell anemia -No current pain crisis. -Hemoglobin stable around 6.  Code Status: Full Code Family Communication: Patient only  Disposition Plan: home when medically stable.   Consultants:  Pulmonary   Antibiotics:  None   Subjective: C/o HA.  Objective: Filed Vitals:   02/09/13 2108 02/10/13 0200 02/10/13 0502 02/10/13 1010  BP: 106/57 105/53 106/56 115/55  Pulse: 98 85 101 99  Temp: 100 F (37.8 C) 98.6 F (37 C) 99.3 F (37.4 C) 98.5 F (36.9 C)  TempSrc: Oral Oral Oral Oral  Resp: 16 16 16 16   Height:      Weight:      SpO2: 94% 90% 94% 98%    Intake/Output Summary (Last 24 hours) at 02/10/13 1416 Last data filed at 02/10/13 0900  Gross per 24 hour  Intake    720 ml  Output      0 ml  Net    720 ml   Filed Weights   02/08/13 1015  Weight: 70.308 kg (155 lb)    Exam:   General:  AA Ox3  Cardiovascular: RRR  Respiratory: CTA B  Abdomen: S/NT/ND/+BS  Extremities: no C/C/E   Neurologic:  Non-focal  Data Reviewed: Basic Metabolic Panel:  Recent Labs Lab 02/08/13 0400 02/09/13 0340  02/10/13 0350  NA 136 133* 134*  K 3.7 4.6 3.9  CL 103 101 101  CO2 23 24 26   GLUCOSE 103* 84 91  BUN 6 5* 5*  CREATININE 0.48* 0.54 0.50  CALCIUM 9.0 8.5 8.8   Liver Function Tests: No results found for this basename: AST, ALT, ALKPHOS, BILITOT, PROT, ALBUMIN,  in the last 168 hours No results found for this basename: LIPASE, AMYLASE,  in the last 168 hours No results found for this basename: AMMONIA,  in the last 168 hours CBC:  Recent Labs Lab 02/08/13 0400 02/09/13 0340 02/10/13 0350  WBC 21.1* 16.7* 13.9*  NEUTROABS 15.6*  --   --   HGB 6.2* 6.0* 6.1*  HCT 16.6* 15.8* 16.2*  MCV 87.4 85.4 88.0  PLT 216 261 260   Cardiac Enzymes: No results found for this basename: CKTOTAL, CKMB, CKMBINDEX, TROPONINI,  in the last 168 hours BNP (last 3 results) No results found for this basename: PROBNP,  in the last 8760 hours CBG: No results found for this basename: GLUCAP,  in the last 168 hours  Recent Results (from the past 240 hour(s))  CULTURE, BLOOD (ROUTINE X 2)     Status: None   Collection Time    02/09/13  2:15 PM      Result Value Range Status   Specimen Description BLOOD LEFT ARM   Final   Special Requests BOTTLES DRAWN AEROBIC AND ANAEROBIC  10CC   Final   Culture  Setup Time     Final   Value: 02/09/2013 17:26     Performed at [facility]   Culture     Final   Value:        BLOOD CULTURE RECEIVED NO GROWTH TO DATE CULTURE WILL BE HELD FOR 5 DAYS BEFORE ISSUING A FINAL NEGATIVE REPORT     Performed at [facility]   Report Status PENDING   Incomplete  CULTURE, BLOOD (ROUTINE X 2)     Status: None   Collection Time    02/09/13  2:30 PM      Result Value Range Status   Specimen Description BLOOD RIGHT HAND   Final   Special Requests BOTTLES DRAWN AEROBIC AND ANAEROBIC 5CC   Final   Culture  Setup Time     Final   Value: 02/09/2013 17:26     Performed at [facility]   Culture     Final   Value:        BLOOD CULTURE RECEIVED NO GROWTH  TO DATE CULTURE WILL BE HELD FOR 5 DAYS BEFORE ISSUING A FINAL NEGATIVE REPORT     Performed at [facility]   Report Status PENDING   Incomplete     Studies: Ct Angio Chest Pe W/cm &/or Wo Cm  02/09/2013   CLINICAL DATA:  Hypoxemia.  Sickle cell.  EXAM: CT ANGIOGRAPHY CHEST WITH CONTRAST  TECHNIQUE: Multidetector CT imaging of the chest was performed using the standard protocol during bolus administration of intravenous contrast. Multiplanar CT image reconstructions including MIPs were obtained to evaluate the vascular anatomy.  CONTRAST:  OMNIPAQUE IOHEXOL 350 MG/ML SOLN  COMPARISON:  Chest x-ray 02/09/2013.  FINDINGS: No filling defects in the pulmonary arteries to suggest pulmonary emboli. Trace bilateral pleural effusions and bibasilar atelectasis. Heart is normal size. No mediastinal, hilar, or axillary adenopathy. Soft tissue within the anterior mediastinum is felt represent residual thymus. Chest wall soft tissues are unremarkable. Imaging into the upper abdomen shows no acute findings.  No acute bony abnormality.  Review of the MIP images confirms the above findings.  IMPRESSION: No evidence of pulmonary embolus.  Dependent and bibasilar opacities most likely reflect atelectasis.   Electronically Signed   By: Charlett Nose M.D.   On: 02/09/2013 13:51   Dg Chest Port 1 View  02/09/2013   CLINICAL DATA:  Hypoxia.  Sickle cell disease.  EXAM: PORTABLE CHEST - 1 VIEW  COMPARISON:  None.  FINDINGS: There is moderate cardiac enlargement. No pleural effusion or edema identified. Diffuse interstitial stress set diffuse increased interstitial prominence is identified and appears unchanged from previous exam. There is no focal airspace consolidation noted.  IMPRESSION: 1.  No acute findings.  2. Stable cardiac enlargement and interstitial prominence.   Electronically Signed   By: Signa Kell M.D.   On: 02/09/2013 07:48    Scheduled Meds: . enoxaparin (LOVENOX) injection  40 mg  Subcutaneous Q24H  . folic acid  1 mg Oral Daily  . norgestimate-ethinyl estradiol  1 tablet Oral Daily  . sodium chloride  3 mL Intravenous Q12H   Continuous Infusions:   Principal Problem:   Acute respiratory failure with hypoxia Active Problems:   Sickle cell pain crisis   Leukocytosis, unspecified   Pulmonary vascular congestion   Anemia, chronic disease   Fever, unspecified    Time spent: 35 minutes    HERNANDEZ [facility]  [facility] Pager [phone]  If 7PM-7AM, please contact night-coverage at [email or web address], password [facility] 02/10/2013, 2:16 PM  LOS: 2 days

## 2013-02-10 NOTE — Progress Notes (Signed)
Spoke to on call provider regarding patient request for Prednisone for a flare up pf her Lupus. New order received for 1 dose of prednisone. Patient will discuss how she titrates her dose with MD in the morning.

## 2013-02-10 NOTE — Progress Notes (Signed)
Rt talked to Dr.Hernandez regarding ABG value 00.0. Dr stated that she may consult pulmonary.

## 2013-02-10 NOTE — Progress Notes (Signed)
  Echocardiogram 2D Echocardiogram has been performed.  [name] 02/10/2013, 2:41 PM

## 2013-02-10 NOTE — Progress Notes (Signed)
Note filed at 12:07 was reported at 08:18 to RN.

## 2013-02-10 NOTE — Progress Notes (Signed)
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  Needs full pft's 11/24  as well    Sandrea Hughs, MD Pulmonary and Critical Care Medicine [facility] Cell [phone] After 5:30 PM or weekends, call [phone]

## 2013-02-10 NOTE — Progress Notes (Signed)
Patient ambulated in hall with out oxygen, O2 saturation down to 77%. Patient at 90-94% on 3 liters O2.

## 2013-02-10 NOTE — Progress Notes (Signed)
Clarification note filed at 12:02 was reported at 08:18.

## 2013-02-11 DIAGNOSIS — M329 Systemic lupus erythematosus, unspecified: Secondary | ICD-10-CM

## 2013-02-11 NOTE — Progress Notes (Signed)
[facility] PROGRESS NOTE  [name] OZH:[id number] DOB: 24-Sep-1990 DOA: 02/08/2013 PCP: No primary provider on file.  Assessment/Plan: Acute Hypoxic Respiratory Failure -Is still in the mid 70s on room air with ambulation. -Have requested ABG that shows a PaO2 of 0 (discussed with respiratory therapy and they tell me that when the PaO2 is below 30 it typically reports as 0) -CT angiography chest is negative particularly for thromboembolic disease but also for pneumonia or effusion. -She must have some sort of shunt physiology to explain her marked hypoxemia.  -Await results of 2-D echocardiogram with bubble study.  -Appreciate pulmonary assistance. -PFTs ordered.  Fever/Leukocytosis -On sources of infection are negative. -Patient does not have any further fevers. -Leukocytosis is trending downwards.  History of sickle cell anemia -No current pain crisis. -Hemoglobin stable around 6.  Code Status: Full Code Family Communication: Mother at bedside updated on plan of care.  Disposition Plan: home when medically stable. May need home oxygen.   Consultants:  Pulmonary   Antibiotics:  None   Subjective: C/o HA.  Objective: Filed Vitals:   02/10/13 1440 02/10/13 2032 02/11/13 0203 02/11/13 0520  BP: 107/43 110/43 108/48 101/43  Pulse: 73 81 81 73  Temp: 98.8 F (37.1 C) 99.2 F (37.3 C) 99.2 F (37.3 C) 98.6 F (37 C)  TempSrc: Oral Oral Oral Oral  Resp: 18 16 16 16   Height:      Weight:      SpO2: 98% 96% 96% 93%    Intake/Output Summary (Last 24 hours) at 02/11/13 1010 Last data filed at 02/11/13 6295  Gross per 24 hour  Intake    840 ml  Output      0 ml  Net    840 ml   Filed Weights   02/08/13 1015  Weight: 70.308 kg (155 lb)    Exam:   General:  AA Ox3  Cardiovascular: RRR  Respiratory: CTA B  Abdomen: S/NT/ND/+BS  Extremities: no C/C/E   Neurologic:  Non-focal  Data Reviewed: Basic Metabolic Panel:  Recent Labs Lab  02/08/13 0400 02/09/13 0340 02/10/13 0350  NA 136 133* 134*  K 3.7 4.6 3.9  CL 103 101 101  CO2 23 24 26   GLUCOSE 103* 84 91  BUN 6 5* 5*  CREATININE 0.48* 0.54 0.50  CALCIUM 9.0 8.5 8.8   Liver Function Tests: No results found for this basename: AST, ALT, ALKPHOS, BILITOT, PROT, ALBUMIN,  in the last 168 hours No results found for this basename: LIPASE, AMYLASE,  in the last 168 hours No results found for this basename: AMMONIA,  in the last 168 hours CBC:  Recent Labs Lab 02/08/13 0400 02/09/13 0340 02/10/13 0350  WBC 21.1* 16.7* 13.9*  NEUTROABS 15.6*  --   --   HGB 6.2* 6.0* 6.1*  HCT 16.6* 15.8* 16.2*  MCV 87.4 85.4 88.0  PLT 216 261 260   Cardiac Enzymes: No results found for this basename: CKTOTAL, CKMB, CKMBINDEX, TROPONINI,  in the last 168 hours BNP (last 3 results) No results found for this basename: PROBNP,  in the last 8760 hours CBG: No results found for this basename: GLUCAP,  in the last 168 hours  Recent Results (from the past 240 hour(s))  URINE CULTURE     Status: None   Collection Time    02/09/13 11:26 AM      Result Value Range Status   Specimen Description URINE, CLEAN CATCH   Final   Special Requests NONE   Final  Culture  Setup Time     Final   Value: 02/09/2013 17:43     Performed at [facility] Count     Final   Value: 3,000 COLONIES/ML     Performed at [facility]   Culture     Final   Value: INSIGNIFICANT GROWTH     Performed at [facility]   Report Status 02/10/2013 FINAL   Final  CULTURE, BLOOD (ROUTINE X 2)     Status: None   Collection Time    02/09/13  2:15 PM      Result Value Range Status   Specimen Description BLOOD LEFT ARM   Final   Special Requests BOTTLES DRAWN AEROBIC AND ANAEROBIC 10CC   Final   Culture  Setup Time     Final   Value: 02/09/2013 17:26     Performed at [facility]   Culture     Final   Value:        BLOOD CULTURE RECEIVED NO GROWTH TO DATE CULTURE  WILL BE HELD FOR 5 DAYS BEFORE ISSUING A FINAL NEGATIVE REPORT     Performed at [facility]   Report Status PENDING   Incomplete  CULTURE, BLOOD (ROUTINE X 2)     Status: None   Collection Time    02/09/13  2:30 PM      Result Value Range Status   Specimen Description BLOOD RIGHT HAND   Final   Special Requests BOTTLES DRAWN AEROBIC AND ANAEROBIC 5CC   Final   Culture  Setup Time     Final   Value: 02/09/2013 17:26     Performed at [facility]   Culture     Final   Value:        BLOOD CULTURE RECEIVED NO GROWTH TO DATE CULTURE WILL BE HELD FOR 5 DAYS BEFORE ISSUING A FINAL NEGATIVE REPORT     Performed at [facility]   Report Status PENDING   Incomplete     Studies: Ct Angio Chest Pe W/cm &/or Wo Cm  02/09/2013   CLINICAL DATA:  Hypoxemia.  Sickle cell.  EXAM: CT ANGIOGRAPHY CHEST WITH CONTRAST  TECHNIQUE: Multidetector CT imaging of the chest was performed using the standard protocol during bolus administration of intravenous contrast. Multiplanar CT image reconstructions including MIPs were obtained to evaluate the vascular anatomy.  CONTRAST:  OMNIPAQUE IOHEXOL 350 MG/ML SOLN  COMPARISON:  Chest x-ray 02/09/2013.  FINDINGS: No filling defects in the pulmonary arteries to suggest pulmonary emboli. Trace bilateral pleural effusions and bibasilar atelectasis. Heart is normal size. No mediastinal, hilar, or axillary adenopathy. Soft tissue within the anterior mediastinum is felt represent residual thymus. Chest wall soft tissues are unremarkable. Imaging into the upper abdomen shows no acute findings.  No acute bony abnormality.  Review of the MIP images confirms the above findings.  IMPRESSION: No evidence of pulmonary embolus.  Dependent and bibasilar opacities most likely reflect atelectasis.   Electronically Signed   By: Charlett Nose M.D.   On: 02/09/2013 13:51    Scheduled Meds: . enoxaparin (LOVENOX) injection  40 mg Subcutaneous Q24H  . folic acid  1  mg Oral Daily  . norgestimate-ethinyl estradiol  1 tablet Oral Daily  . sodium chloride  3 mL Intravenous Q12H   Continuous Infusions:   Principal Problem:   Acute respiratory failure with hypoxia Active Problems:   Sickle cell pain crisis   Leukocytosis, unspecified  Pulmonary vascular congestion   Anemia, chronic disease   Fever, unspecified    Time spent: 35 minutes    HERNANDEZ Ellis,Whitney Dimaio  [facility] Pager [phone]  If 7PM-7AM, please contact night-coverage at [email or web address], password [facility] 02/11/2013, 10:10 AM  LOS: 3 days

## 2013-02-11 NOTE — Consult Note (Addendum)
PULMONARY  / CRITICAL CARE MEDICINE  Name: [name] MRN: [id number] DOB: [birth year]    ADMISSION DATE:  02/08/2013 CONSULTATION DATE:  02/11/13  REFERRING MD :  Hernandez/ [facility] PRIMARY SERVICE: [facility] Outpt MD:  Myna Hidalgo  CHIEF COMPLAINT:  "SS crisis#  BRIEF PATIENT DESCRIPTION:   22 yobf never smoker/ college senior followed by Dr Myna Hidalgo for freq SS flares with treatment = when I flare I take dilaudid, when it fails to work I go to ER" admit 02/08/13 with what she thought was a typical flare but assoc with new light headedness which does not correlate with activity and found to have hypoxemia so PCCM consulted.  SIGNIFICANT EVENTS / STUDIES:  CTa 02/09/13 > No evidence of pulmonary embolus.  Dependent and bibasilar opacities most likely reflect atelectasis. PFT's 02/11/13 >>>    HISTORY OF PRESENT ILLNESS:    22 y/o woman with h/o sickle cell disease and SLE, who presented to the ED with upper extremity pain that is typical for her SS crises. She received IV dilaudid and her pain improved to the point where she wanted to be discharged home. EDP was in the process of discharging her, when she became drowsy and her sats dropped into the 70s requiring a NRB. CXR shows some pulmonary vascular congestion. [facility] asked to admit her for further evaluation and management> very drowsy and is unable to participate in conversation as she quickly dosed off so admitted for further eval  Pt says she was fine until 11/20 in fact flew to [place] for job interview 11/19 with no sob flying commercially nor sob walking through airports, across parking lots and up steps  No obvious day to day or daytime variabilty or assoc chronic cough or cp or chest tightness, subjective wheeze overt sinus or hb symptoms. No unusual exp hx or h/o childhood pna/ asthma or knowledge of premature birth.  Sleeping ok without nocturnal  or early am exacerbation  of respiratory  c/o's or need for noct saba. Also  denies any obvious fluctuation of symptoms with weather or environmental changes or other aggravating or alleviating factors except as outlined above   Current Medications, Allergies, Complete Past Medical History, Past Surgical History, Family History, and Social History were reviewed in [employer] record.  ROS  The following are not active complaints unless bolded sore throat, dysphagia, dental problems, itching, sneezing,  nasal congestion or excess/ purulent secretions, ear ache,   fever, chills, sweats, unintended wt loss, pleuritic or exertional cp, hemoptysis,  orthopnea pnd or leg swelling, presyncope, palpitations, heartburn, abdominal pain, anorexia, nausea, vomiting, diarrhea  or change in bowel or urinary habits, change in stools or urine, dysuria,hematuria,  rash, arthralgias, visual complaints, headache, numbness weakness or ataxia or problems with walking or coordination,  change in mood/affect or memory.        PAST MEDICAL HISTORY :  Past Medical History  Diagnosis Date  . Sickle cell anemia   . 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 Lupus   . Heart murmur   . Sickle cell anemia 05/06/2012   Past Surgical History  Procedure Laterality Date  . Cholecystectomy     Prior to Admission medications   Medication Sig Start Date End Date Taking? Authorizing Provider  diphenhydrAMINE (BENADRYL) 25 mg capsule Take 1-2 capsules (25-50 mg total) by mouth every 4 (four) hours as needed for itching. 11/12/12  Yes Alison Murray, MD  folic acid (FOLVITE) 1 MG tablet Take 1 mg by  mouth daily.   Yes Historical Provider, MD  HYDROmorphone (DILAUDID) 4 MG tablet Take 1 tablet (4 mg total) by mouth every 4 (four) hours as needed for pain. 11/12/12  Yes Alison Murray, MD  norgestimate-ethinyl estradiol (ORTHO-CYCLEN,SPRINTEC,PREVIFEM) 0.25-35 MG-MCG tablet Take 1 tablet by mouth daily.   Yes Historical Provider, MD  oxyCODONE (ROXICODONE) 15 MG immediate release tablet  Take 1 tablet (15 mg total) by mouth every 6 (six) hours as needed for pain. 11/12/12  Yes Alison Murray, MD  SUMAtriptan (IMITREX) 50 MG tablet Take 50 mg by mouth every 2 (two) hours as needed for migraine or headache. May repeat in 2 hours if headache persists or recurs.   Yes Historical Provider, MD  valACYclovir (VALTREX) 500 MG tablet Take 500 mg by mouth 2 (two) times daily as needed. For outbreak   Yes Historical Provider, MD   Allergies  Allergen Reactions  . Ibuprofen Other (See Comments)    Due to other medications she takes for Lupus    FAMILY HISTORY:  Family History  Problem Relation Age of Onset  . Sickle cell trait Mother   . Sickle cell trait Father   . Breast cancer Maternal Aunt     X 2  . Cancer Maternal Grandfather     Pancreatic Cancer  . Diabetes Maternal Aunt    SOCIAL HISTORY:  reports that she has never smoked. She has never used smokeless tobacco. She reports that she does not drink alcohol or use illicit drugs.     SUBJECTIVE:  Feeling better and wants to go home - no sob, lightheadedness, sob or cp/ cough/ no sickle symptoms  VITAL SIGNS: Temp:  [98.5 F (36.9 C)-99.2 F (37.3 C)] 98.6 F (37 C) (11/24 0520) Pulse Rate:  [73-99] 73 (11/24 0520) Resp:  [16-18] 16 (11/24 0520) BP: (101-115)/(43-55) 101/43 mmHg (11/24 0520) SpO2:  [93 %-98 %] 93 % (11/24 0520) FIO2  3lpm      INTAKE / OUTPUT: Intake/Output     11/23 0701 - 11/24 0700 11/24 0701 - 11/25 0700   P.O. 840    I.V. (mL/kg)     Total Intake(mL/kg) 840 (11.9)    Urine (mL/kg/hr)     Total Output       Net +840          Urine Occurrence 9 x    Stool Occurrence 1 x      PHYSICAL EXAMINATION: General: pleasant bf with somewhat of a flat affect HEENT: nl dentition, turbinates, and orophanx. Nl external ear canals without cough reflex   NECK :  without JVD/Nodes/TM/ nl carotid upstrokes bilaterally   LUNGS: no acc muscle use,  Bilateral coarse basilar insp crackles   CV:   RRR  no s3 or murmur or increase in P2, no edema   ABD:  soft and nontender with nl excursion in the supine position. No bruits or organomegaly, bowel sounds nl  MS:  warm without deformities, calf tenderness, cyanosis or clubbing  SKIN: warm and dry without lesions    NEURO:  alert, approp, no deficits   LABS:  CBC  Recent Labs Lab 02/08/13 0400 02/09/13 0340 02/10/13 0350  WBC 21.1* 16.7* 13.9*  HGB 6.2* 6.0* 6.1*  HCT 16.6* 15.8* 16.2*  PLT 216 261 260   Coag's No results found for this basename: APTT, INR,  in the last 168 hours BMET  Recent Labs Lab 02/08/13 0400 02/09/13 0340 02/10/13 0350  NA 136 133* 134*  K 3.7  4.6 3.9  CL 103 101 101  CO2 23 24 26   BUN 6 5* 5*  CREATININE 0.48* 0.54 0.50  GLUCOSE 103* 84 91   Electrolytes  Recent Labs Lab 02/08/13 0400 02/09/13 0340 02/10/13 0350  CALCIUM 9.0 8.5 8.8   Sepsis Markers No results found for this basename: LATICACIDVEN, PROCALCITON, O2SATVEN,  in the last 168 hours ABG  Recent Labs Lab 02/10/13 0815  PHART 7.428  PCO2ART 40.9  PO2ART 00.0*   Liver Enzymes No results found for this basename: AST, ALT, ALKPHOS, BILITOT, ALBUMIN,  in the last 168 hours Cardiac Enzymes No results found for this basename: TROPONINI, PROBNP,  in the last 168 hours Glucose No results found for this basename: GLUCAP,  in the last 168 hours  Imaging Ct Angio Chest Pe W/cm &/or Wo Cm  02/09/2013   CLINICAL DATA:  Hypoxemia.  Sickle cell.  EXAM: CT ANGIOGRAPHY CHEST WITH CONTRAST  TECHNIQUE: Multidetector CT imaging of the chest was performed using the standard protocol during bolus administration of intravenous contrast. Multiplanar CT image reconstructions including MIPs were obtained to evaluate the vascular anatomy.  CONTRAST:  OMNIPAQUE IOHEXOL 350 MG/ML SOLN  COMPARISON:  Chest x-ray 02/09/2013.  FINDINGS: No filling defects in the pulmonary arteries to suggest pulmonary emboli. Trace bilateral pleural  effusions and bibasilar atelectasis. Heart is normal size. No mediastinal, hilar, or axillary adenopathy. Soft tissue within the anterior mediastinum is felt represent residual thymus. Chest wall soft tissues are unremarkable. Imaging into the upper abdomen shows no acute findings.  No acute bony abnormality.  Review of the MIP images confirms the above findings.  IMPRESSION: No evidence of pulmonary embolus.  Dependent and bibasilar opacities most likely reflect atelectasis.   Electronically Signed   By: Charlett Nose M.D.   On: 02/09/2013 13:51        ASSESSMENT / PLAN:  1)  Unexplained hypoxemia in pt with recurrent SS and h/o SLE She probably has enough ILD/ atx coupled with dilaudid rx and assoc atx to cause desat, esp in setting of low venous 02 sat from hgb of 6.0 to cause her to be 02 dep for now. - the use of dilaudid in this setting is very problematic because it takes away the nl "alarm" for desats which can enhance the SS so if she's going to use dilaudid she'll need to monitor her sats - ABG's spurious and would ignore, no need to repeat - combination of SLE and SS (? How does she tell the difference - ? Needs rheum outpt rx in addition to heme) - in meantime needs  A) Echo looking for PAH/ shunt (less likely given she does saturate with low flow 02 which rules against R to L shunt B) Full PFTs C) 02 at rest and with exertion if sat < 89% and go ahead and use 3 lpm hs until returns to office to regroup D) would check HIV to be complete (last neg 2013)     Sandrea Hughs, MD Pulmonary and Critical Care Medicine [facility] Cell [phone] After 5:30 PM or weekends, call [phone]

## 2013-02-11 NOTE — Progress Notes (Signed)
Pt. Left AMA education was provided. IV was removed and pt was transported home by  Her mother.

## 2013-02-11 NOTE — Clinical Documentation Improvement (Signed)
Pt with Sickle Cell   Clarification Needed   Please clarify if pt's pulmonary vascular congestion in setting of sickle cell anemia and respiratory failure can be further specified as one of the diagnoses listed below and document in pn or d/c summary.    Possible Clinical Conditions? Acute Chest syndrome ____________________ _______Other Condition__________________ _______Cannot Clinically Determine   Supporting Information: Risk Factors: See above Signs & Symptoms: Diagnostics: Chest Xray 02/08/13 FINDINGS:There is interval slight increase in the pulmonary vascular congestion. Main pulmonary artery and as chronically prominent. No effusions or pulmonary edema.   Treatment  Thank You, Enis Slipper ,RN Clinical Documentation Specialist:  [phone]  [facility]- Health Information Management

## 2013-02-15 LAB — CULTURE, BLOOD (ROUTINE X 2): Culture: NO GROWTH

## 2013-05-01 NOTE — Discharge Summary (Signed)
Please note that this patient decided to leave the hospital against medical advice. No prescriptions or follow ups were provided.  Peggye PittEstela Hernandez, MD [facility] Pager: [phone]

## 2013-07-18 ENCOUNTER — Telehealth: Payer: Self-pay | Admitting: Hematology & Oncology

## 2013-07-18 NOTE — Telephone Encounter (Signed)
Faxed Medical Records via fax today to:  °[facility] °Ph: [phone] °Fx: [phone] ° ° °Medical  Records requested from ALL to present ° ° °CONSENT COPY SCANNED ° ° ° °

## 2013-10-31 ENCOUNTER — Telehealth: Payer: Self-pay | Admitting: Hematology & Oncology

## 2013-10-31 NOTE — Telephone Encounter (Signed)
Pt called made 9-23 appointment

## 2013-11-26 IMAGING — CR DG CHEST 2V
2 series · 2 of 2 positions shown · non-contrast
Comparison: 05/06/2012

CLINICAL DATA: Sickle cell pain

CHEST - 2 VIEW

[w chest lat]
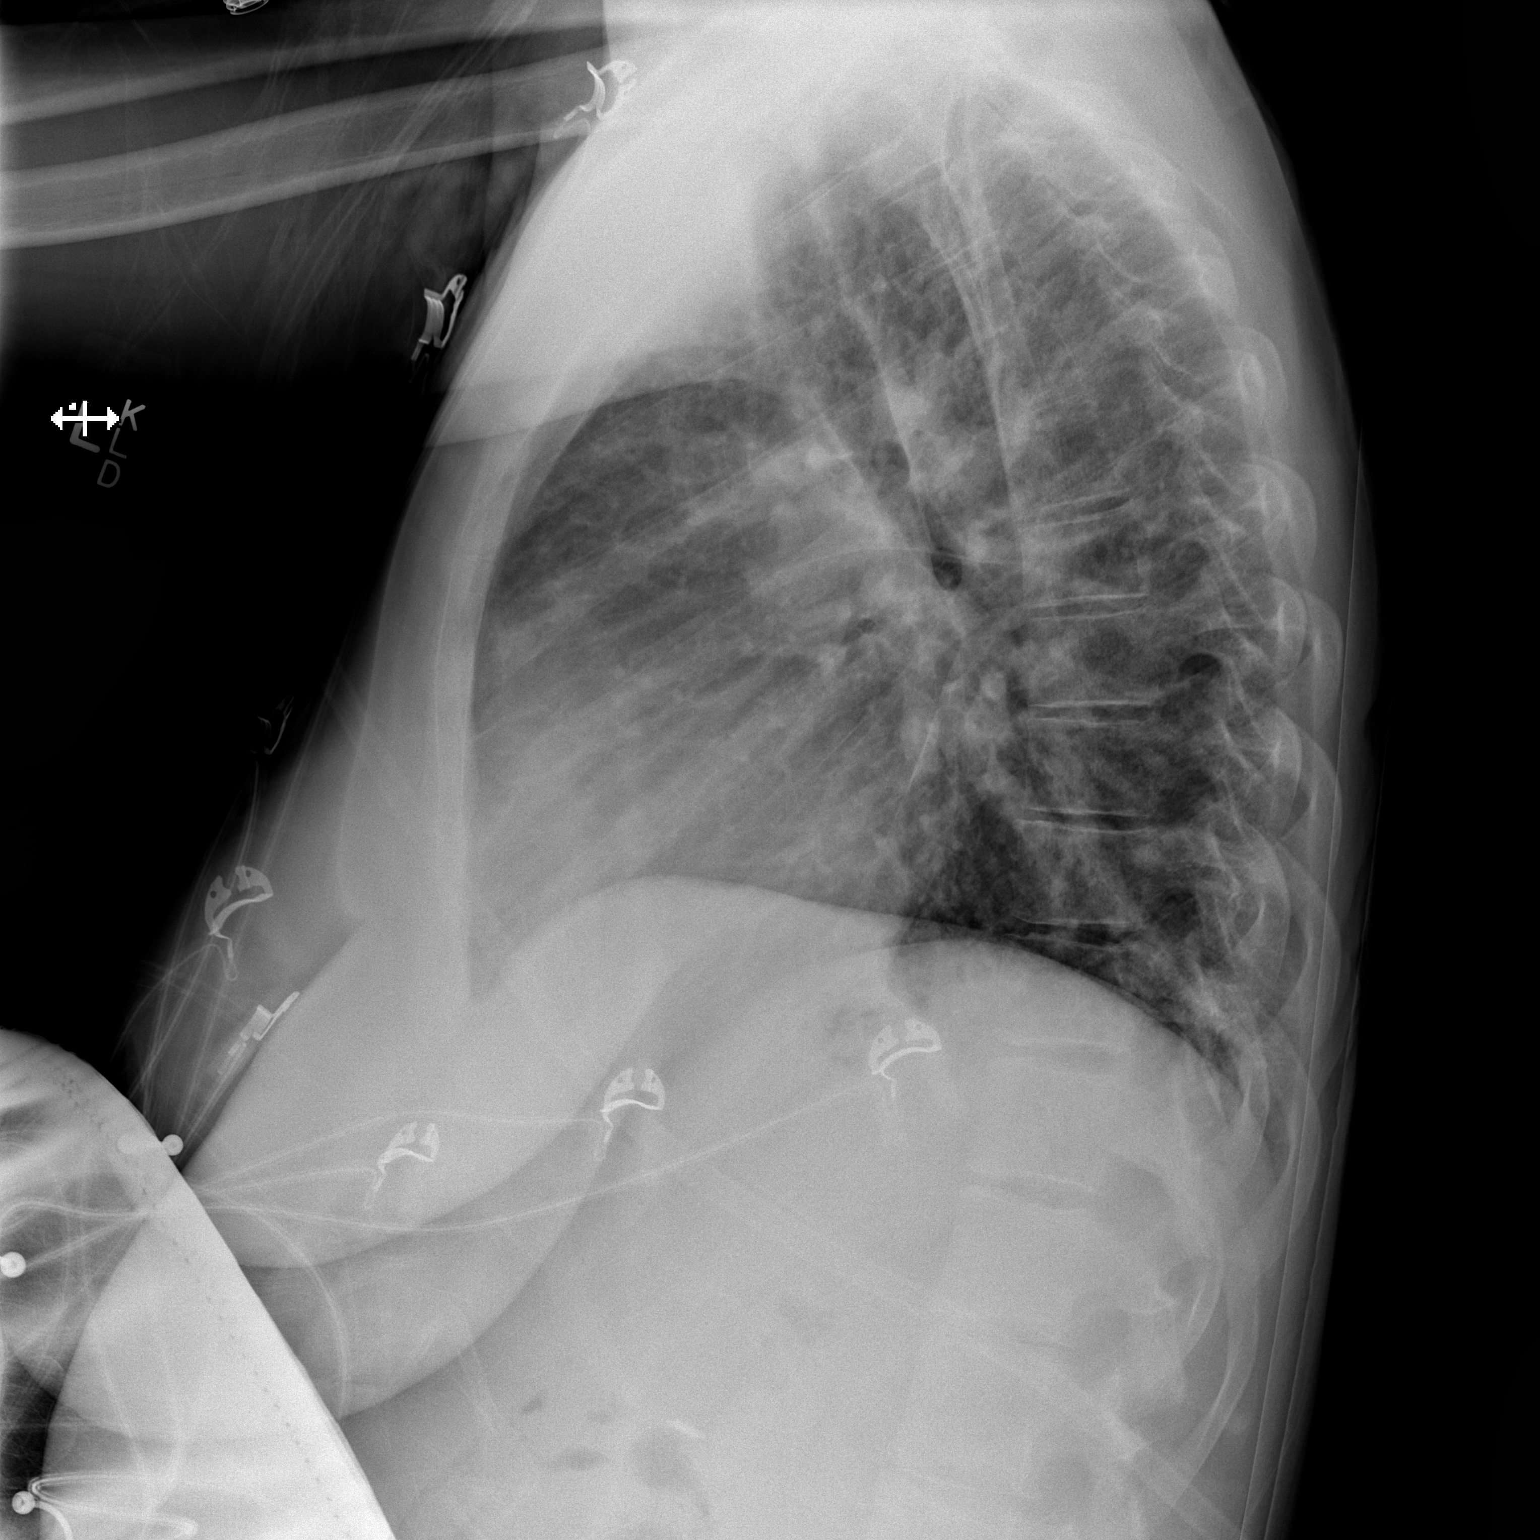

[x chest ap]
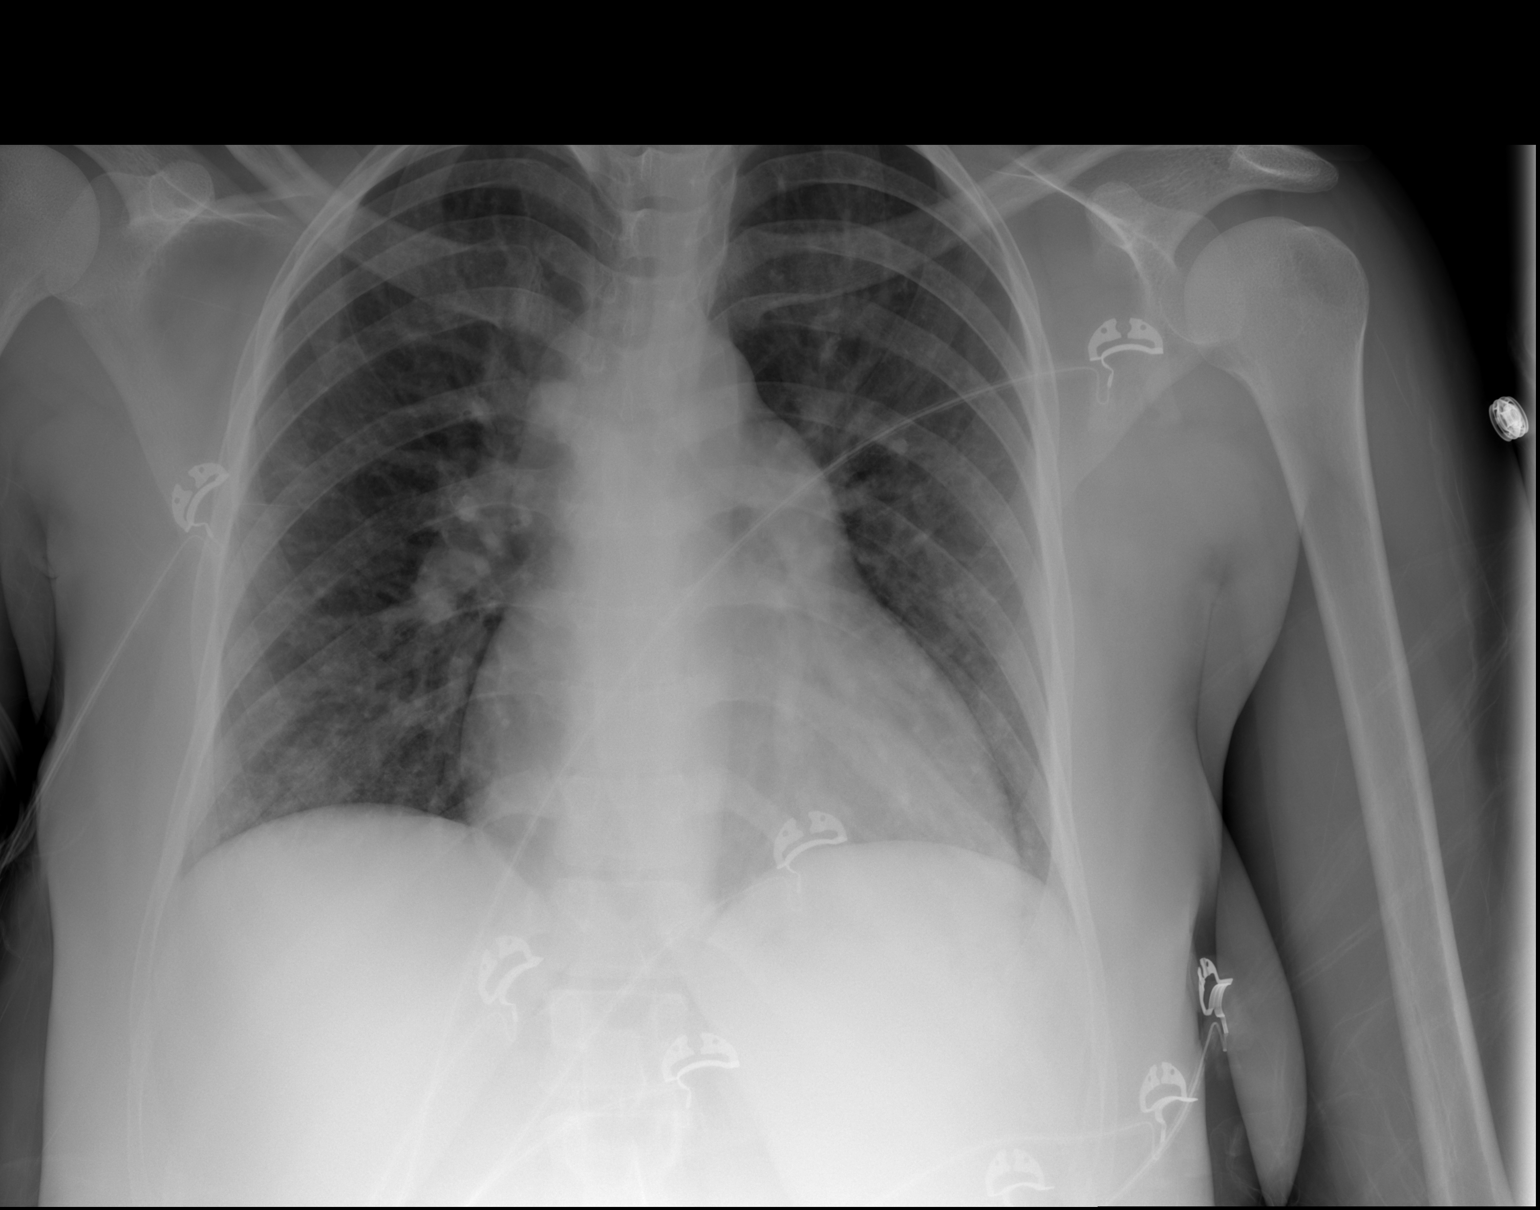

[2 of 2 positions shown; findings below may reference images not displayed]

FINDINGS: There is mild to moderate enlargement of the cardiac
silhouette, stable.  The mediastinum is normal in contour caliber.
No hilar masses are noted.

There are subtle perihilar and lower lung zone ill-defined airspace
opacities and central vascular congestion.  No pleural effusion or
pneumothorax.
IMPRESSION: Stable cardiac enlargement.  Subtle airspace lung opacities are
new, which, along with central vascular congestion, suggests mild
pulmonary edema.

## 2013-12-10 ENCOUNTER — Other Ambulatory Visit: Payer: Self-pay | Admitting: *Deleted

## 2013-12-10 DIAGNOSIS — D571 Sickle-cell disease without crisis: Secondary | ICD-10-CM

## 2013-12-11 ENCOUNTER — Ambulatory Visit (HOSPITAL_BASED_OUTPATIENT_CLINIC_OR_DEPARTMENT_OTHER): Payer: Medicaid Other | Admitting: Hematology & Oncology

## 2013-12-11 ENCOUNTER — Other Ambulatory Visit (HOSPITAL_BASED_OUTPATIENT_CLINIC_OR_DEPARTMENT_OTHER): Payer: Medicaid Other | Admitting: Lab

## 2013-12-11 ENCOUNTER — Other Ambulatory Visit: Payer: Self-pay | Admitting: *Deleted

## 2013-12-11 ENCOUNTER — Encounter: Payer: Self-pay | Admitting: Hematology & Oncology

## 2013-12-11 VITALS — BP 112/50 | HR 77 | Temp 98.1°F | Resp 14 | Ht 67.0 in | Wt 157.0 lb

## 2013-12-11 DIAGNOSIS — D571 Sickle-cell disease without crisis: Secondary | ICD-10-CM

## 2013-12-11 DIAGNOSIS — M329 Systemic lupus erythematosus, unspecified: Secondary | ICD-10-CM

## 2013-12-11 LAB — RETICULOCYTES (CHCC)
ABS Retic: 372.3 10*3/uL — ABNORMAL HIGH (ref 19.0–186.0)
RBC.: 2.27 MIL/uL — ABNORMAL LOW (ref 3.87–5.11)
RETIC CT PCT: 16.4 % — AB (ref 0.4–2.3)

## 2013-12-11 LAB — CBC WITH DIFFERENTIAL (CANCER CENTER ONLY)
BASO#: 0 10*3/uL (ref 0.0–0.2)
BASO%: 0.2 % (ref 0.0–2.0)
EOS ABS: 0.1 10*3/uL (ref 0.0–0.5)
EOS%: 0.7 % (ref 0.0–7.0)
HCT: 20.6 % — ABNORMAL LOW (ref 34.8–46.6)
HGB: 7.5 g/dL — ABNORMAL LOW (ref 11.6–15.9)
LYMPH#: 3.9 10*3/uL — ABNORMAL HIGH (ref 0.9–3.3)
LYMPH%: 36.6 % (ref 14.0–48.0)
MCH: 33.6 pg (ref 26.0–34.0)
MCHC: 36.4 g/dL — ABNORMAL HIGH (ref 32.0–36.0)
MCV: 92 fL (ref 81–101)
MONO#: 0.5 10*3/uL (ref 0.1–0.9)
MONO%: 4.8 % (ref 0.0–13.0)
NEUT%: 57.7 % (ref 39.6–80.0)
NEUTROS ABS: 6.2 10*3/uL (ref 1.5–6.5)
Platelets: 422 10*3/uL — ABNORMAL HIGH (ref 145–400)
RBC: 2.23 10*6/uL — AB (ref 3.70–5.32)
RDW: 19.9 % — AB (ref 11.1–15.7)
WBC: 10.7 10*3/uL — ABNORMAL HIGH (ref 3.9–10.0)

## 2013-12-11 LAB — CHCC SATELLITE - SMEAR

## 2013-12-11 LAB — TECHNOLOGIST REVIEW CHCC SATELLITE

## 2013-12-11 MED ORDER — VALACYCLOVIR HCL 500 MG PO TABS: 500.0000 mg | ORAL_TABLET | Freq: Two times a day (BID) | ORAL | Status: DC | PRN

## 2013-12-11 MED ORDER — SUMATRIPTAN SUCCINATE 50 MG PO TABS: 50.0000 mg | ORAL_TABLET | Freq: Four times a day (QID) | ORAL | Status: DC | PRN

## 2013-12-11 NOTE — Progress Notes (Signed)
[facility]  [phone] [phone]  ID: Rosalie Doctor OB: [birth year] MR#: [id number] JYN#:[id number] Patient Care Team: [name], MD as Attending Physician (Internal Medicine)  DIAGNOSIS: Hemoglobin SS disease.  Lupus  INTERVAL HISTORY: Ms. Elbe is here today for a follow-up. She says that she had a pain crisis that lasted 2 weeks a week ago. She states that she managed it at home with fluids and dilaudid. She is now having numbness in her left shoulder with tingling down her arm to her wrist. This has been going on for about a week she says. She denies any weakness in that arm, facial weakness or dysphagia. She is having headaches periodically. She denies fever, chills, n/v, cough, rash, dizziness, SOB, chest pain, palpitations, constipation, diarrhea, blood in urine or stool. She denies swelling, tenderness, numbness or tingling in her other extremities. Her appetite is good and she is drinking plenty of fluids. 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 She hasn't had any issues with her lupus. Her Hgb today is 7.5.   CURRENT TREATMENT: Folic acid 2 mg p.o. q. day  REVIEW OF SYSTEMS: All other 10 point review of systems is negative except for those issues mentioned above.   PAST MEDICAL HISTORY: Past Medical History  Diagnosis Date  . Sickle cell anemia   . 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 Lupus   . Heart murmur   . Sickle cell anemia 05/06/2012   PAST SURGICAL HISTORY: Past Surgical History  Procedure Laterality Date  . Cholecystectomy     FAMILY HISTORY Family History  Problem Relation Age of Onset  . Sickle cell trait Mother   . Sickle cell trait Father   . Breast cancer Maternal Aunt     X 2  . Cancer Maternal Grandfather     Pancreatic Cancer  . Diabetes Maternal Aunt    GYNECOLOGIC HISTORY:  No LMP recorded.   SOCIAL HISTORY:  History   Social History  . Marital Status: Single    Spouse Name: N/A    Number of Children: N/A  . Years of Education: N/A   Occupational History  . Not on file.   Social History Main Topics  . Smoking status: Never Smoker   . Smokeless tobacco: Never Used     Comment: never used tobacco  . Alcohol Use: No  . Drug Use: No  . Sexual Activity: Yes    Birth Control/ Protection: None   Other Topics Concern  . Not on file   Social History Narrative  . No narrative on file   ADVANCED DIRECTIVES: <no information>  HEALTH MAINTENANCE: History  Substance Use Topics  . Smoking status: Never Smoker   . Smokeless tobacco: Never Used     Comment: never used tobacco  . Alcohol Use: No   Colonoscopy: PAP: Bone density: Lipid panel:  Allergies  Allergen Reactions  . Ibuprofen Other (See Comments)    Due to other medications she takes for Lupus    Current Outpatient Prescriptions  Medication Sig Dispense Refill  . diphenhydrAMINE (BENADRYL) 25 mg capsule Take 1-2 capsules (25-50 mg total) by mouth every 4 (four) hours as needed for itching.  60 capsule  0  . folic acid (FOLVITE) 1 MG tablet Take 1 mg by mouth daily.      Marland Kitchen HYDROmorphone (DILAUDID) 4 MG tablet Take 1 tablet (4 mg total) by mouth every 4 (four) hours as needed  for pain.  60 tablet  0  . norgestimate-ethinyl estradiol (ORTHO-CYCLEN,SPRINTEC,PREVIFEM) 0.25-35 MG-MCG tablet Take 1 tablet by mouth daily.      Marland Kitchen oxyCODONE (ROXICODONE) 15 MG immediate release tablet Take 1 tablet (15 mg total) by mouth every 6 (six) hours as needed for pain.  60 tablet  0  . SUMAtriptan (IMITREX) 50 MG tablet Take 50 mg by mouth every 2 (two) hours as needed for migraine or headache. May repeat in 2 hours if headache persists or recurs.      . valACYclovir (VALTREX) 500 MG tablet Take 500 mg by mouth 2 (two) times daily as needed. For outbreak       No current facility-administered medications for this visit.   OBJECTIVE: Filed Vitals:   12/11/13 1359  BP: 112/50  Pulse: 77  Temp:  98.1 F (36.7 C)  Resp: 14   Body mass index is 24.58 kg/(m^2). ECOG FS:1 - Symptomatic but completely ambulatory Ocular: Sclerae unicteric, pupils equal, round and reactive to light Ear-nose-throat: Oropharynx clear, dentition fair Lymphatic: No cervical or supraclavicular adenopathy Lungs no rales or rhonchi, good excursion bilaterally Heart regular rate and rhythm, no murmur appreciated Abd soft, nontender, positive bowel sounds MSK no focal spinal tenderness, no joint edema Neuro: non-focal, well-oriented, appropriate affect Breasts: Deferred  LAB RESULTS: CMP     Component Value Date/Time   NA 134* 02/10/2013 0350   K 3.9 02/10/2013 0350   CL 101 02/10/2013 0350   CO2 26 02/10/2013 0350   GLUCOSE 91 02/10/2013 0350   BUN 5* 02/10/2013 0350   CREATININE 0.50 02/10/2013 0350   CALCIUM 8.8 02/10/2013 0350   PROT 7.1 11/12/2012 0405   ALBUMIN 3.5 11/12/2012 0405   AST 49* 11/12/2012 0405   ALT 15 11/12/2012 0405   ALKPHOS 62 11/12/2012 0405   BILITOT 3.3* 11/12/2012 0405   GFRNONAA >90 02/10/2013 0350   GFRAA >90 02/10/2013 0350   No results found for this basename: SPEP, UPEP,  kappa and lambda light chains   Lab Results  Component Value Date   WBC 10.7* 12/11/2013   NEUTROABS 6.2 12/11/2013   HGB 7.5* 12/11/2013   HCT 20.6* 12/11/2013   MCV 92 12/11/2013   PLT 422* 12/11/2013   No results found for this basename: LABCA2   No components found with this basename: [id number]   No results found for this basename: INR,  in the last 168 hours  STUDIES: No results found.  ASSESSMENT/PLAN: Ms. 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 She also has lupus. She had a pain crisis a week ago and is now having numbness in her left shoulder and tingling in the left arm.  We will get an MRI of the brain tomorrow and then possibly consult neurology. Her Hgb today was 7.5. We will wait and see what the rest of her labs show.  She is still  taking Folic acid daily.  We will wait for the results of her MRI and then schedule her next follow-up appointment.  She knows to call here with any questions or concerns and to go to the ED in the event of an emergency. We can certainly see her sooner if need be.   Verdie Mosher, NP 12/11/2013 2:35 PM

## 2013-12-12 ENCOUNTER — Telehealth: Payer: Self-pay | Admitting: Hematology & Oncology

## 2013-12-12 LAB — IRON AND TIBC CHCC
%SAT: 32 % (ref 21–57)
IRON: 112 ug/dL (ref 41–142)
TIBC: 348 ug/dL (ref 236–444)
UIBC: 236 ug/dL (ref 120–384)

## 2013-12-12 LAB — FERRITIN CHCC: Ferritin: 113 ng/ml (ref 9–269)

## 2013-12-12 NOTE — Telephone Encounter (Signed)
Per Delice Bison, I called scheduling Hermelinda Dellen aware MRI needs to be w/o contrast only

## 2013-12-13 ENCOUNTER — Ambulatory Visit (HOSPITAL_COMMUNITY)
Admission: RE | Admit: 2013-12-13 | Discharge: 2013-12-13 | Disposition: A | Discharge status: Home / Self Care | Payer: Medicaid Other | Source: Ambulatory Visit | Attending: Family | Admitting: Family

## 2013-12-13 DIAGNOSIS — R209 Unspecified disturbances of skin sensation: Secondary | ICD-10-CM | POA: Diagnosis not present

## 2013-12-13 DIAGNOSIS — M25519 Pain in unspecified shoulder: Secondary | ICD-10-CM | POA: Diagnosis not present

## 2013-12-13 DIAGNOSIS — D571 Sickle-cell disease without crisis: Secondary | ICD-10-CM | POA: Insufficient documentation

## 2013-12-16 ENCOUNTER — Emergency Department (HOSPITAL_COMMUNITY): Payer: Medicaid Other

## 2013-12-16 ENCOUNTER — Encounter (HOSPITAL_COMMUNITY): Payer: Self-pay | Admitting: Emergency Medicine

## 2013-12-16 ENCOUNTER — Emergency Department (HOSPITAL_COMMUNITY)
Admission: EM | Admit: 2013-12-16 | Discharge: 2013-12-16 | Disposition: A | Discharge status: Home / Self Care | Payer: Medicaid Other | Attending: Emergency Medicine | Admitting: Emergency Medicine

## 2013-12-16 DIAGNOSIS — R0902 Hypoxemia: Secondary | ICD-10-CM | POA: Insufficient documentation

## 2013-12-16 DIAGNOSIS — D5700 Hb-SS disease with crisis, unspecified: Secondary | ICD-10-CM

## 2013-12-16 DIAGNOSIS — Z79899 Other long term (current) drug therapy: Secondary | ICD-10-CM | POA: Diagnosis not present

## 2013-12-16 DIAGNOSIS — R011 Cardiac murmur, unspecified: Secondary | ICD-10-CM | POA: Insufficient documentation

## 2013-12-16 DIAGNOSIS — M329 Systemic lupus erythematosus, unspecified: Secondary | ICD-10-CM | POA: Diagnosis not present

## 2013-12-16 DIAGNOSIS — D6489 Other specified anemias: Secondary | ICD-10-CM | POA: Insufficient documentation

## 2013-12-16 LAB — CBC WITH DIFFERENTIAL/PLATELET
BASOS ABS: 0 10*3/uL (ref 0.0–0.1)
Basophils Relative: 0 % (ref 0–1)
EOS ABS: 0.2 10*3/uL (ref 0.0–0.7)
Eosinophils Relative: 1 % (ref 0–5)
HCT: 18.9 % — ABNORMAL LOW (ref 36.0–46.0)
Hemoglobin: 7 g/dL — ABNORMAL LOW (ref 12.0–15.0)
Lymphocytes Relative: 24 % (ref 12–46)
Lymphs Abs: 3.7 10*3/uL (ref 0.7–4.0)
MCH: 33.2 pg (ref 26.0–34.0)
MCHC: 37 g/dL — AB (ref 30.0–36.0)
MCV: 89.6 fL (ref 78.0–100.0)
MONO ABS: 0.9 10*3/uL (ref 0.1–1.0)
Monocytes Relative: 6 % (ref 3–12)
NEUTROS PCT: 69 % (ref 43–77)
Neutro Abs: 10.6 10*3/uL — ABNORMAL HIGH (ref 1.7–7.7)
PLATELETS: 368 10*3/uL (ref 150–400)
RBC: 2.11 MIL/uL — ABNORMAL LOW (ref 3.87–5.11)
RDW: 20.6 % — AB (ref 11.5–15.5)
WBC: 15.4 10*3/uL — ABNORMAL HIGH (ref 4.0–10.5)

## 2013-12-16 LAB — COMPREHENSIVE METABOLIC PANEL
ALBUMIN: 3.8 g/dL (ref 3.5–5.2)
ALK PHOS: 55 U/L (ref 39–117)
ALT: 15 U/L (ref 0–35)
AST: 41 U/L — ABNORMAL HIGH (ref 0–37)
Anion gap: 14 (ref 5–15)
BUN: 7 mg/dL (ref 6–23)
CALCIUM: 8.7 mg/dL (ref 8.4–10.5)
CO2: 20 mEq/L (ref 19–32)
Chloride: 106 mEq/L (ref 96–112)
Creatinine, Ser: 0.47 mg/dL — ABNORMAL LOW (ref 0.50–1.10)
GFR calc non Af Amer: >90 mL/min (ref >90)
Glucose, Bld: 103 mg/dL — ABNORMAL HIGH (ref 70–99)
Potassium: 3.8 mEq/L (ref 3.7–5.3)
SODIUM: 140 meq/L (ref 137–147)
TOTAL PROTEIN: 7.5 g/dL (ref 6.0–8.3)
Total Bilirubin: 4.7 mg/dL — ABNORMAL HIGH (ref 0.3–1.2)

## 2013-12-16 LAB — RETICULOCYTES
RBC.: 2.11 MIL/uL — AB (ref 3.87–5.11)
Retic Ct Pct: >23 % — ABNORMAL HIGH (ref 0.4–3.1)

## 2013-12-16 MED ORDER — MORPHINE SULFATE 4 MG/ML IJ SOLN
4.0000 mg | Freq: Once | INTRAMUSCULAR | Status: AC
  Administered 2013-12-16: 4 mg via INTRAVENOUS
  Filled 2013-12-16: qty 1

## 2013-12-16 MED ORDER — SODIUM CHLORIDE 0.9 % IV SOLN
25.0000 mg | INTRAVENOUS | Status: DC | PRN
  Administered 2013-12-16: 25 mg via INTRAVENOUS
  Filled 2013-12-16: qty 0.5

## 2013-12-16 MED ORDER — ONDANSETRON HCL 4 MG/2ML IJ SOLN
4.0000 mg | Freq: Once | INTRAMUSCULAR | Status: AC
  Administered 2013-12-16: 4 mg via INTRAVENOUS
  Filled 2013-12-16: qty 2

## 2013-12-16 MED ORDER — SODIUM CHLORIDE 0.9 % IV BOLUS (SEPSIS)
1000.0000 mL | Freq: Once | INTRAVENOUS | Status: AC
  Administered 2013-12-16: 1000 mL via INTRAVENOUS

## 2013-12-16 NOTE — ED Notes (Signed)
Patient transported to X-ray 

## 2013-12-16 NOTE — ED Notes (Signed)
Patient ambulated 46 feet. At the beginning of the ambulation patient's O2 saturation was 94% on room air. At the end of the ambulation patient's O2 saturation was at 87%

## 2013-12-16 NOTE — ED Provider Notes (Signed)
Medical screening examination/treatment/procedure(s) were performed by non-physician practitioner and as supervising physician I was immediately available for consultation/collaboration.   EKG Interpretation None       Kolina Kube R. Naheem Mosco, MD 12/16/13 1535 

## 2013-12-16 NOTE — ED Provider Notes (Signed)
CSN: [id number]     Arrival date & time 12/16/13  0820 History   First MD Initiated Contact with Patient 12/16/13 [phone]     Chief Complaint  Patient presents with  . Sickle Cell Pain Crisis     (Consider location/radiation/quality/duration/timing/severity/associated sxs/prior Treatment) HPI  Patient to the ED from home with sickle cell crisis in chest, arms and legs. 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 It is like her normal crisis aside from her shoulder pain (right) which is a new kind of pain for her. She reports not having frequent attacks. Her last visit was from 01/2013. She feels that it probably was caused by the weather change. She has not had fever or SOB. Denies weakness, numbness, confusion or coughing. She tried taking Motrin and a 4 mg Dilaudid tab. This only helped mildly, therefore she came to the ED for further assistance.    Her sickle cell disease is compounded by the fact that she also has lupus. She reports that her HGB is usually 6.5 and she is not usually transfused until she gets to 6.0.    Past Medical History  Diagnosis Date  . Sickle cell anemia   . 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 Lupus   . Heart murmur   . Sickle cell anemia 05/06/2012   Past Surgical History  Procedure Laterality Date  . Cholecystectomy     Family History  Problem Relation Age of Onset  . Sickle cell trait Mother   . Sickle cell trait Father   . Breast cancer Maternal Aunt     X 2  . Cancer Maternal Grandfather     Pancreatic Cancer  . Diabetes Maternal Aunt    History  Substance Use Topics  . Smoking status: Never Smoker   . Smokeless tobacco: Never Used     Comment: never used tobacco  . Alcohol Use: No   OB History   Grav Para Term Preterm Abortions TAB SAB Ect Mult Living                 Review of Systems  All other systems reviewed and are negative.       Allergies  Ibuprofen  Home Medications   Prior to  Admission medications   Medication Sig Start Date End Date Taking? Authorizing Provider  diphenhydrAMINE (BENADRYL) 25 mg capsule Take 25-50 mg by mouth every 4 (four) hours as needed for itching.   Yes Historical Provider, MD  folic acid (FOLVITE) 1 MG tablet Take 1 mg by mouth daily.   Yes Historical Provider, MD  HYDROmorphone (DILAUDID) 4 MG tablet Take 4 mg by mouth every 4 (four) hours as needed for severe pain.   Yes Historical Provider, MD  norgestimate-ethinyl estradiol (ORTHO-CYCLEN,SPRINTEC,PREVIFEM) 0.25-35 MG-MCG tablet Take 1 tablet by mouth daily.   Yes Historical Provider, MD  oxyCODONE (ROXICODONE) 15 MG immediate release tablet Take 15 mg by mouth every 6 (six) hours as needed for pain.   Yes Historical Provider, MD  SUMAtriptan (IMITREX) 50 MG tablet Take 50 mg by mouth every 2 (two) hours as needed for migraine or headache. May repeat in 2 hours if headache persists or recurs.   Yes Historical Provider, MD  valACYclovir (VALTREX) 500 MG tablet Take 500 mg by mouth 2 (two) times daily as needed (outbreaks).   Yes Historical Provider, MD   BP 122/68  Pulse 57  Temp(Src) 98 F (36.7 C) (Oral)  Resp 15  SpO2 98%  LMP 12/02/2013 Physical Exam  Nursing note and vitals reviewed. Constitutional: She appears well-developed and well-nourished. No distress.  HENT:  Head: Normocephalic and atraumatic.  Eyes: Pupils are equal, round, and reactive to light.  Neck: Normal range of motion. Neck supple.  Cardiovascular: Normal rate and regular rhythm.   Pulmonary/Chest: Effort normal and breath sounds normal. No accessory muscle usage. No respiratory distress. She has no decreased breath sounds. She has no wheezes. She has no rhonchi. She has no rales.  Abdominal: Soft. Bowel sounds are normal. She exhibits no distension. There is no tenderness. There is no guarding.  Musculoskeletal:       Right shoulder: Normal.       Left shoulder: Normal.       Right hip: Normal.       Left hip:  Normal.  Neurological: She is alert.  Skin: Skin is warm and dry.    ED Course  Procedures (including critical care time) Labs Review Labs Reviewed  CBC WITH DIFFERENTIAL - Abnormal; Notable for the following:    WBC 15.4 (*)    RBC 2.11 (*)    Hemoglobin 7.0 (*)    HCT 18.9 (*)    MCHC 37.0 (*)    RDW 20.6 (*)    Neutro Abs 10.6 (*)    All other components within normal limits  COMPREHENSIVE METABOLIC PANEL - Abnormal; Notable for the following:    Glucose, Bld 103 (*)    Creatinine, Ser 0.47 (*)    AST 41 (*)    Total Bilirubin 4.7 (*)    All other components within normal limits  RETICULOCYTES - Abnormal; Notable for the following:    Retic Ct Pct >23.0 (*)    RBC. 2.11 (*)    All other components within normal limits    Imaging Review Dg Chest 2 View  12/16/2013   CLINICAL DATA:  Mid chest pain and simple sickle cell crisis with onset this morning.  EXAM: CHEST  2 VIEW  COMPARISON:  Portable chest x-ray of February 09, 2013  FINDINGS: The lungs are adequately inflated. The interstitial markings are coarse but stable. The cardiopericardial silhouette is mildly enlarged. There is enlargement of the left atrial appendage. The mediastinum is normal in width. The central pulmonary vascularity is engorged. There is no pleural effusion. The bony thorax exhibits no acute abnormalities.  IMPRESSION: The findings are consistent with chronic CHF with mild interstitial edema stable since the previous study. There is no evidence of pneumonia.   Electronically Signed   By: David  [place]   On: 12/16/2013 10:11     EKG Interpretation None      MDM   Final diagnoses:  Sickle cell crisis  Anemia due to other cause  Hypoxia    Medications  diphenhydrAMINE (BENADRYL) 25 mg in sodium chloride 0.9 % 50 mL IVPB (25 mg Intravenous Given 12/16/13 0924)  sodium chloride 0.9 % bolus 1,000 mL (1,000 mLs Intravenous New Bag/Given 12/16/13 0917)  ondansetron (ZOFRAN) injection 4 mg (4 mg  Intravenous Given 12/16/13 0917)  morphine 4 MG/ML injection 4 mg (4 mg Intravenous Given 12/16/13 0917)    10: 31 am Pt sedated from the benadryl and morphine. Having no more pain and sleepy. Will wait until she is less sedated and then ambulate with pulse ox.  At the beginning of the ambulation patient's O2 saturation was 94% on room air. At the end of the ambulation patient's O2 saturation was at  87% for tech  I spoke with Dr. Gonzella Lex with [facility] who was very helpful. At last visit pt was seen by pulmonologist for her hypoxia and signed out AMA prior to PFT being done for chronic hypoxia. She never appears to have follow-up. He does not believe there will be any medical benefit from admission at this time if her pain is under control. Pt is sleepy from medication but easily arousable. Attending physician made aware of pt course and that she will be discharged. Referral to Dr. Sherene Sires given for her to have PFT test done.   23 y.o.[name]'s evaluation in the Emergency Department is complete. It has been determined that no acute conditions requiring further emergency intervention are present at this time. The patient/guardian have been advised of the diagnosis and plan. We have discussed signs and symptoms that warrant return to the ED, such as changes or worsening in symptoms.  Vital signs are stable at discharge. Filed Vitals:   12/16/13 1100  BP:   Pulse: 57  Temp:   Resp: 15    Patient/guardian has voiced understanding and agreed to follow-up with the PCP or specialist.    Dorthula Matas, PA-C 12/16/13 1237

## 2013-12-16 NOTE — ED Notes (Signed)
Unable to ambulate patient at this time. PA aware. 

## 2013-12-16 NOTE — ED Notes (Signed)
Bed: AV40 Expected date:  Expected time:  Means of arrival:  Comments: EMS- Sickle Cell Crisis/Chest pain

## 2013-12-16 NOTE — Discharge Instructions (Signed)
Sickle Cell Anemia, Adult °Sickle cell anemia is a condition in which red blood cells have an abnormal "sickle" shape. This abnormal shape shortens the cells' life span, which results in a lower than normal concentration of red blood cells in the blood. The sickle shape also causes the cells to clump together and block free blood flow through the blood vessels. As a result, the tissues and organs of the body do not receive enough oxygen. Sickle cell anemia causes organ damage and pain and increases the risk of infection. °CAUSES  °Sickle cell anemia is a genetic disorder. Those who receive two copies of the gene have the condition, and those who receive one copy have the trait. °RISK FACTORS °The sickle cell gene is most common in people whose families originated in [place]. Other areas of the globe where sickle cell trait occurs include the Mediterranean, [place] and [place], the [place], and the [place].  °SIGNS AND SYMPTOMS °· Pain, especially in the extremities, back, chest, or abdomen (common). The pain may start suddenly or may develop following an illness, especially if there is dehydration. Pain can also occur due to overexertion or exposure to extreme temperature changes. °· Frequent severe bacterial infections, especially certain types of pneumonia and meningitis. °· Pain and swelling in the hands and feet. °· Decreased activity.   °· Loss of appetite.   °· Change in behavior. °· Headaches. °· Seizures. °· Shortness of breath or difficulty breathing. °· Vision changes. °· Skin ulcers. °Those with the trait may not have symptoms or they may have mild symptoms.  °DIAGNOSIS  °Sickle cell anemia is diagnosed with blood tests that demonstrate the genetic trait. It is often diagnosed during the newborn period, due to mandatory testing nationwide. A variety of blood tests, X-rays, CT scans, MRI scans, ultrasounds, and lung function tests may also be done to monitor the condition. °TREATMENT  °Sickle  cell anemia may be treated with: °· Medicines. You may be given pain medicines, antibiotic medicines (to treat and prevent infections) or medicines to increase the production of certain types of hemoglobin. °· Fluids. °· Oxygen. °· Blood transfusions. °HOME CARE INSTRUCTIONS  °· Drink enough fluid to keep your urine clear or pale yellow. Increase your fluid intake in hot weather and during exercise. °· Do not smoke. Smoking lowers oxygen levels in the blood.   °· Only take over-the-counter or prescription medicines for pain, fever, or discomfort as directed by your health care provider. °· Take antibiotics as directed by your health care provider. Make sure you finish them it even if you start to feel better.   °· Take supplements as directed by your health care provider.   °· Consider wearing a medical alert bracelet. This tells anyone caring for you in an emergency of your condition.   °· When traveling, keep your medical information, health care provider's names, and the medicines you take with you at all times.   °· If you develop a fever, do not take medicines to reduce the fever right away. This could cover up a problem that is developing. Notify your health care provider. °· Keep all follow-up appointments with your health care provider. Sickle cell anemia requires regular medical care. °SEEK MEDICAL CARE IF: ° You have a fever. °SEEK IMMEDIATE MEDICAL CARE IF:  °· You feel dizzy or faint.   °· You have new abdominal pain, especially on the left side near the stomach area.   °· You develop a persistent, often uncomfortable and painful penile erection (priapism). If this is not treated immediately it   will lead to impotence.   °· You have numbness your arms or legs or you have a hard time moving them.   °· You have a hard time with speech.   °· You have a fever or persistent symptoms for more than 2-3 days.   °· You have a fever and your symptoms suddenly get worse.   °· You have signs or symptoms of infection.  These include:   °¨ Chills.   °¨ Abnormal tiredness (lethargy).   °¨ Irritability.   °¨ Poor eating.   °¨ Vomiting.   °· You develop pain that is not helped with medicine.   °· You develop shortness of breath. °· You have pain in your chest.   °· You are coughing up pus-like or bloody sputum.   °· You develop a stiff neck. °· Your feet or hands swell or have pain. °· Your abdomen appears bloated. °· You develop joint pain. °MAKE SURE YOU: °· Understand these instructions. °Document Released: 06/15/2005 Document Revised: 07/22/2013 Document Reviewed: 10/17/2012 °ExitCare® Patient Information ©2015 [employer]. This information is not intended to replace advice given to you by your health care provider. Make sure you discuss any questions you have with your health care provider. ° °

## 2013-12-16 NOTE — ED Notes (Signed)
Per EMS pt coming from home with c/o sickle cell crisis pain in chest, arms and legs. Sts that is when her crisis usually is, sts usually well controlled with meds at home, took  morphine at 0600 today, pain is now 6/10

## 2013-12-16 NOTE — Progress Notes (Signed)
Address in EPIC listed as [address] [place] Per EPIC e medicaid response hx pcp is Provider: [facility] from [place]  EPIC updated

## 2013-12-18 ENCOUNTER — Other Ambulatory Visit: Payer: Self-pay | Admitting: Family

## 2013-12-18 ENCOUNTER — Telehealth: Payer: Self-pay | Admitting: Hematology & Oncology

## 2013-12-18 DIAGNOSIS — D571 Sickle-cell disease without crisis: Secondary | ICD-10-CM

## 2013-12-18 NOTE — Telephone Encounter (Signed)
Mailed 11-2014 schedule

## 2013-12-23 ENCOUNTER — Institutional Professional Consult (permissible substitution): Payer: Medicaid Other | Admitting: Internal Medicine

## 2014-02-23 IMAGING — CR DG CHEST 1V PORT
1 series · 1 of 1 positions shown · non-contrast
Comparison: 02/08/2013 at [DATE] a.m. and 11/11/2012

CLINICAL DATA: Hypoxia.  Sickle Cell.

EXAM:
PORTABLE CHEST - 1 VIEW [DATE] a.m.

[AP]
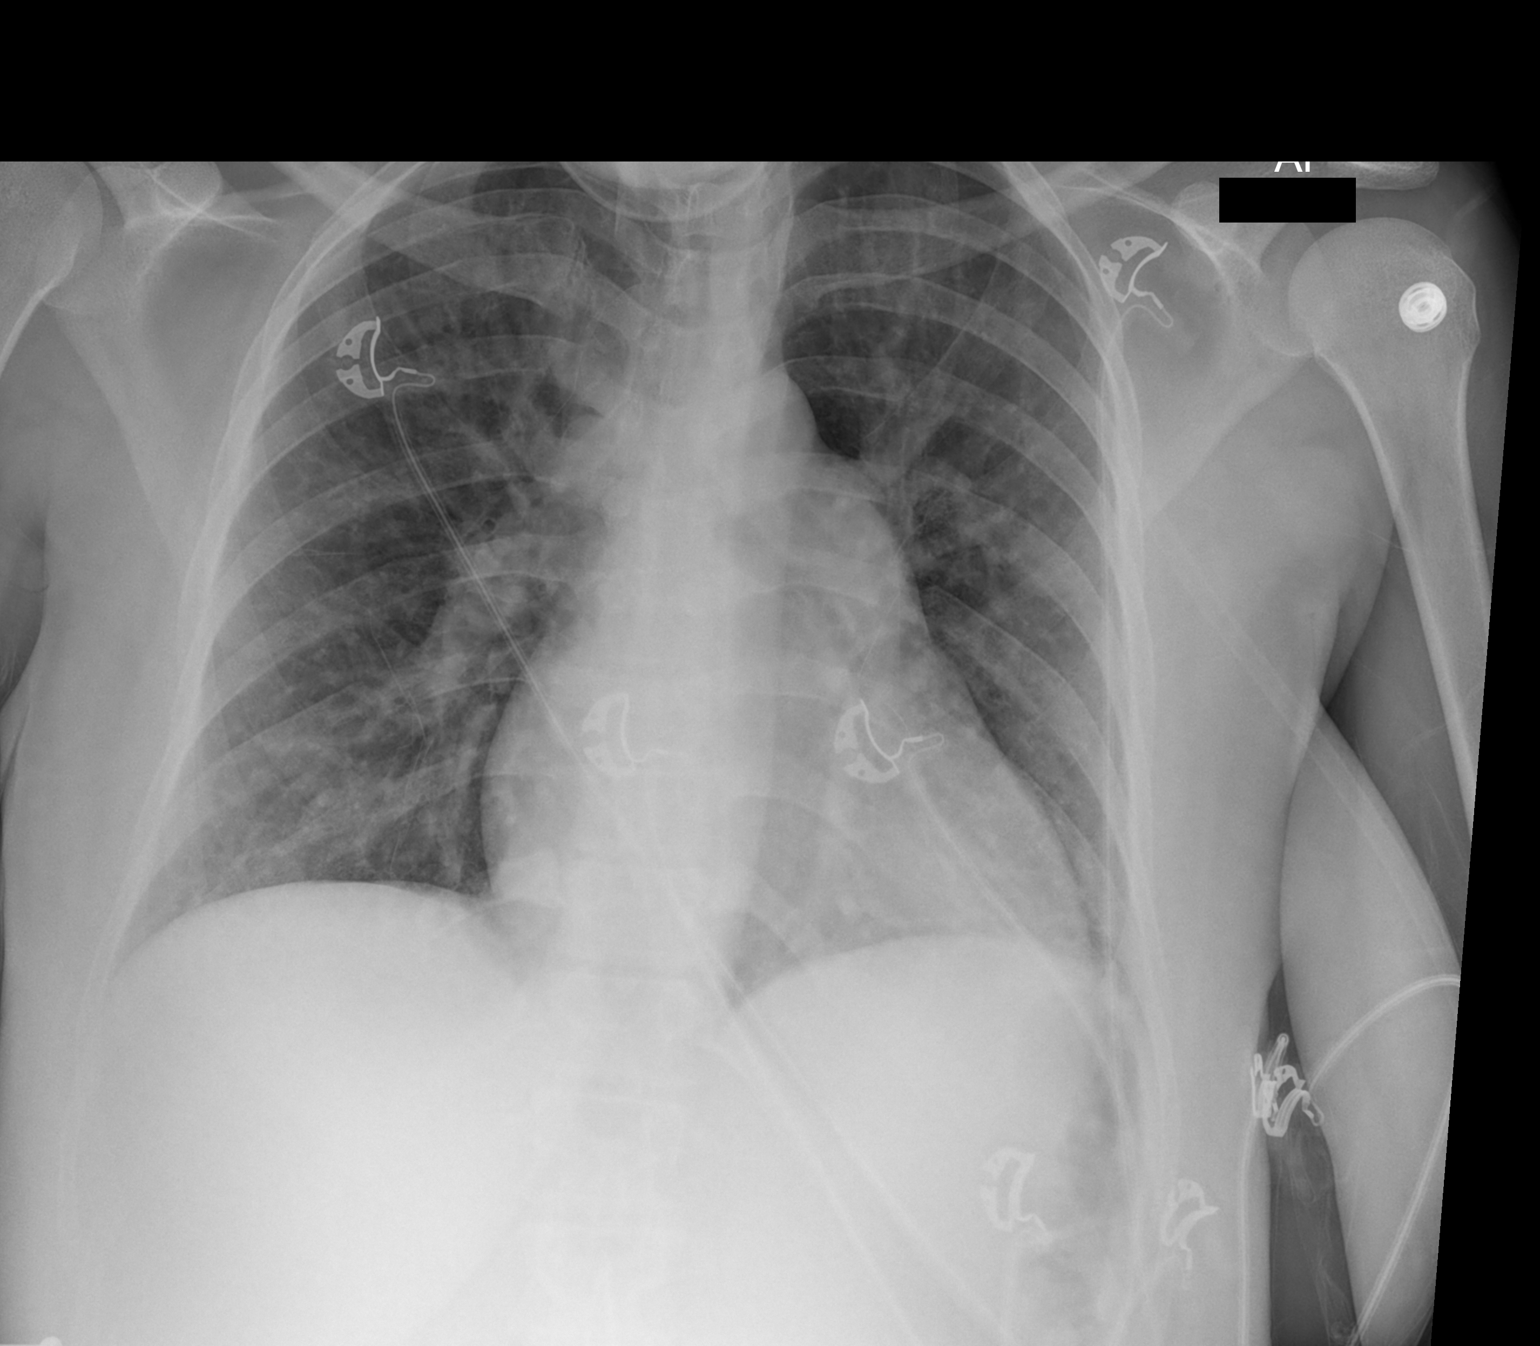

[1 of 1 positions shown; findings below may reference images not displayed]

FINDINGS: There is interval slight increase in the pulmonary vascular
congestion. Main pulmonary artery and as chronically prominent. No
effusions or pulmonary edema.

No acute osseous abnormality.
IMPRESSION: Increased pulmonary vascular congestion.

## 2014-02-23 IMAGING — CR DG CHEST 2V
2 series · 2 of 2 positions shown · non-contrast
Comparison: Radiograph from 11/11/2012

CLINICAL DATA: Sickle cell crisis

EXAM:
CHEST  2 VIEW

[w chest lat]
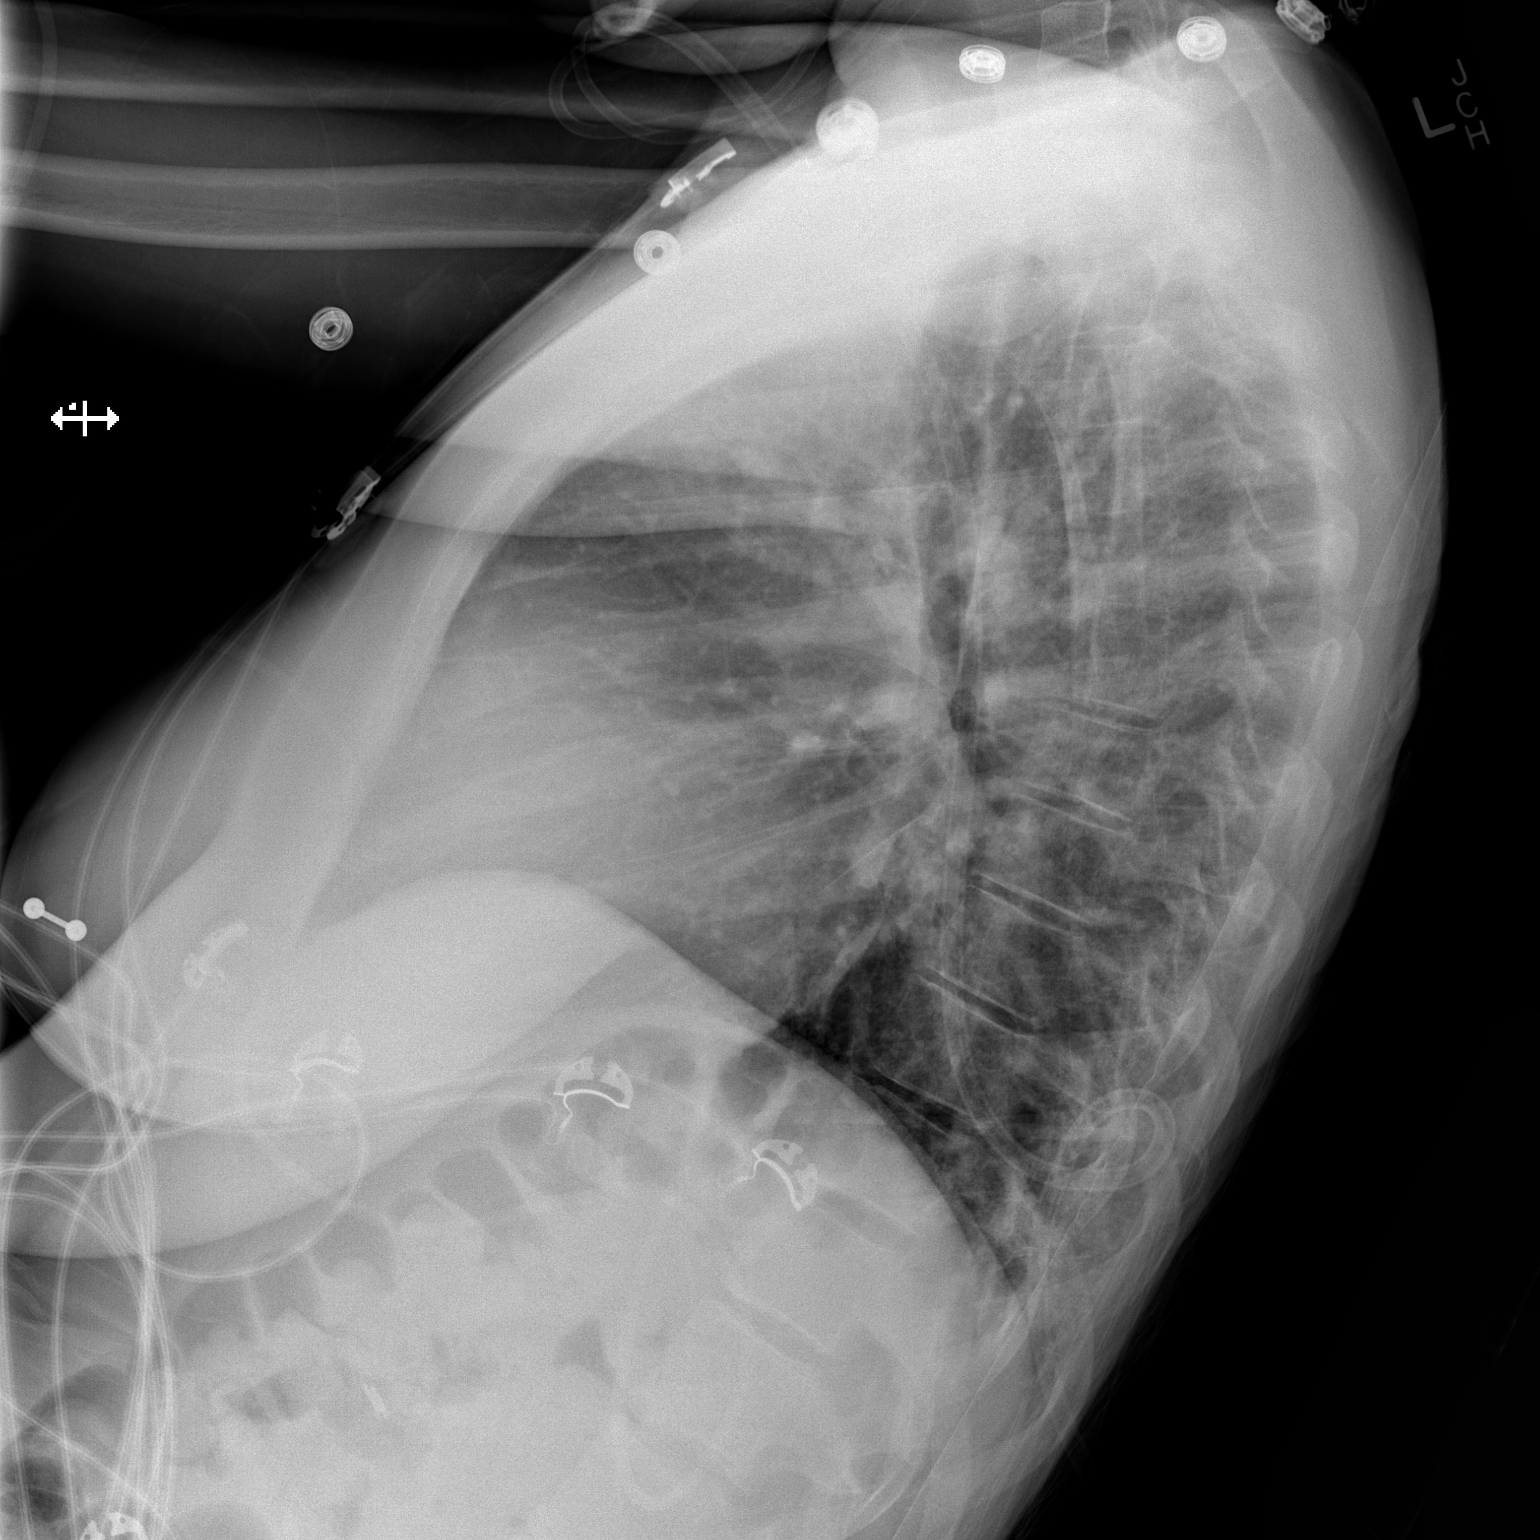

[x chest ap]
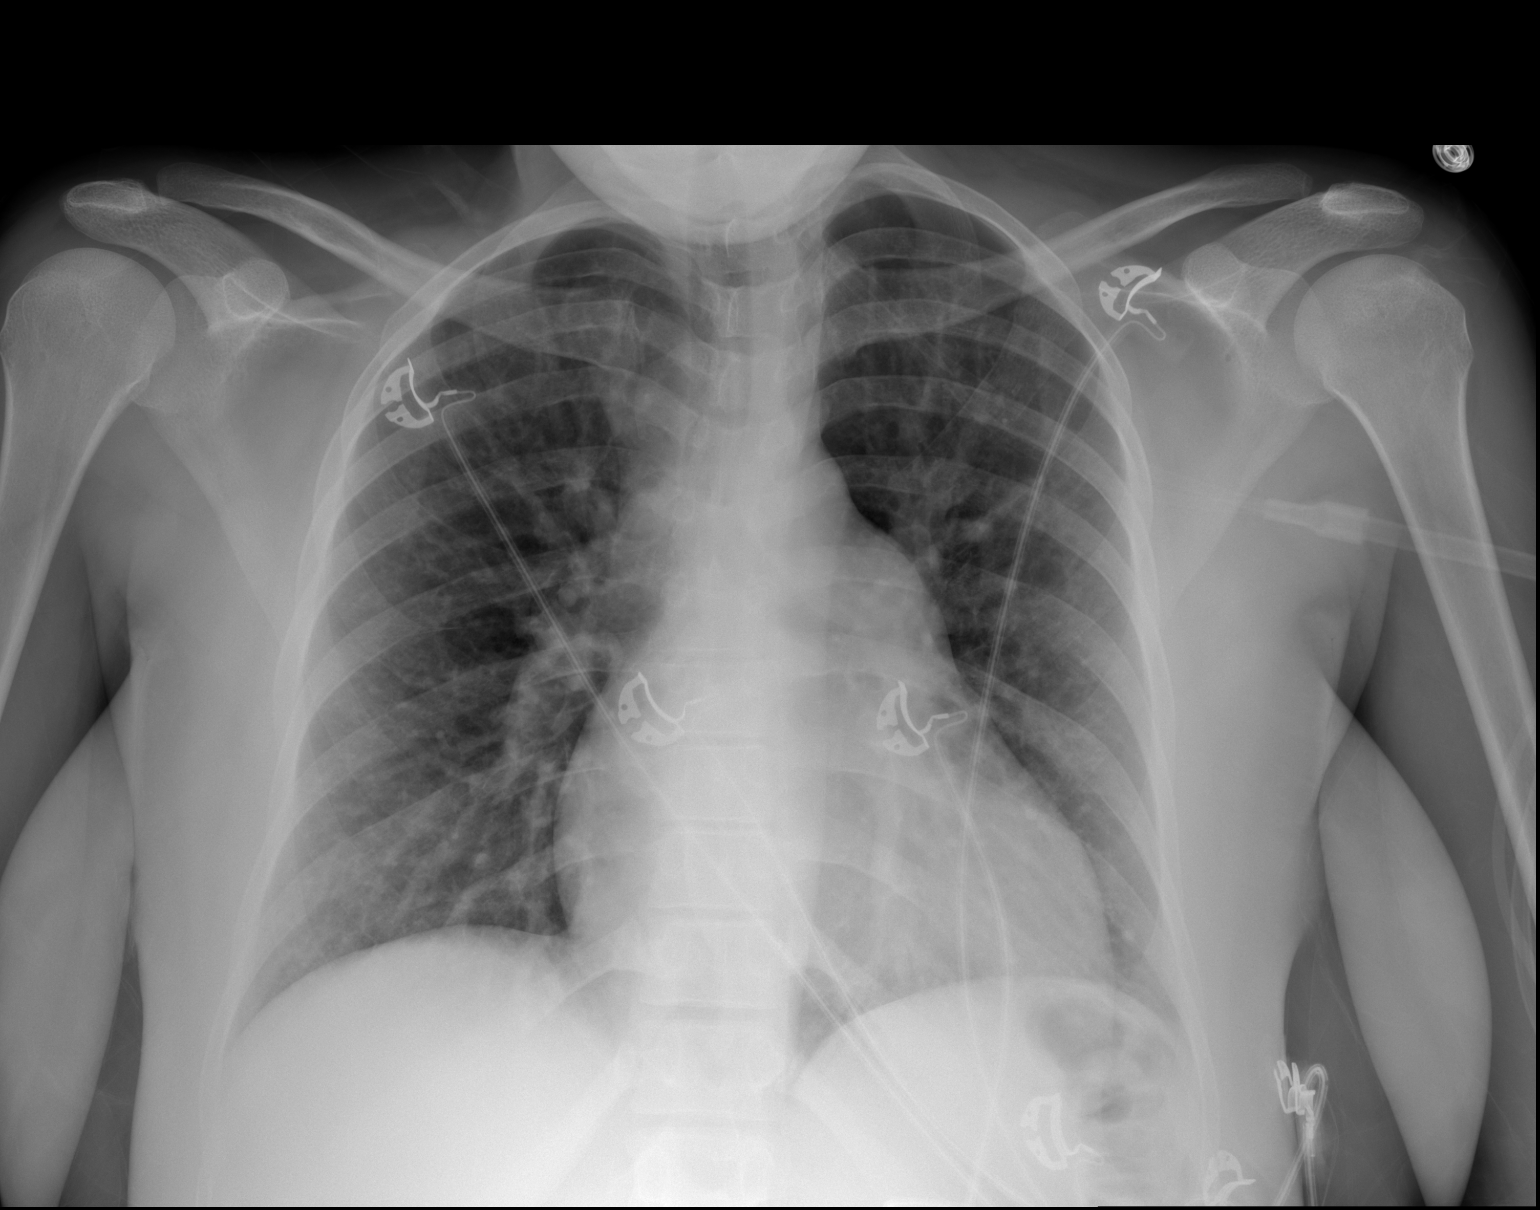

[2 of 2 positions shown; findings below may reference images not displayed]

FINDINGS: Cardiomegaly is stable as compared to the prior exam, consistent
with known history with sickle cell disease.

Lungs are normally inflated. No focal infiltrate identified. There
is mild perihilar vascular congestion without pulmonary edema. No
pneumothorax or pleural effusion.

The visualized osseous structures are within normal limits.
IMPRESSION: Stable cardiomegaly with mild perihilar vascular congestion. No
overt pulmonary edema or focal infiltrate identified.

## 2014-02-24 IMAGING — CR DG CHEST 1V PORT
1 series · 1 of 1 positions shown · non-contrast
Comparison: None.

CLINICAL DATA: Hypoxia.  Sickle cell disease.

EXAM:
PORTABLE CHEST - 1 VIEW

[AP]
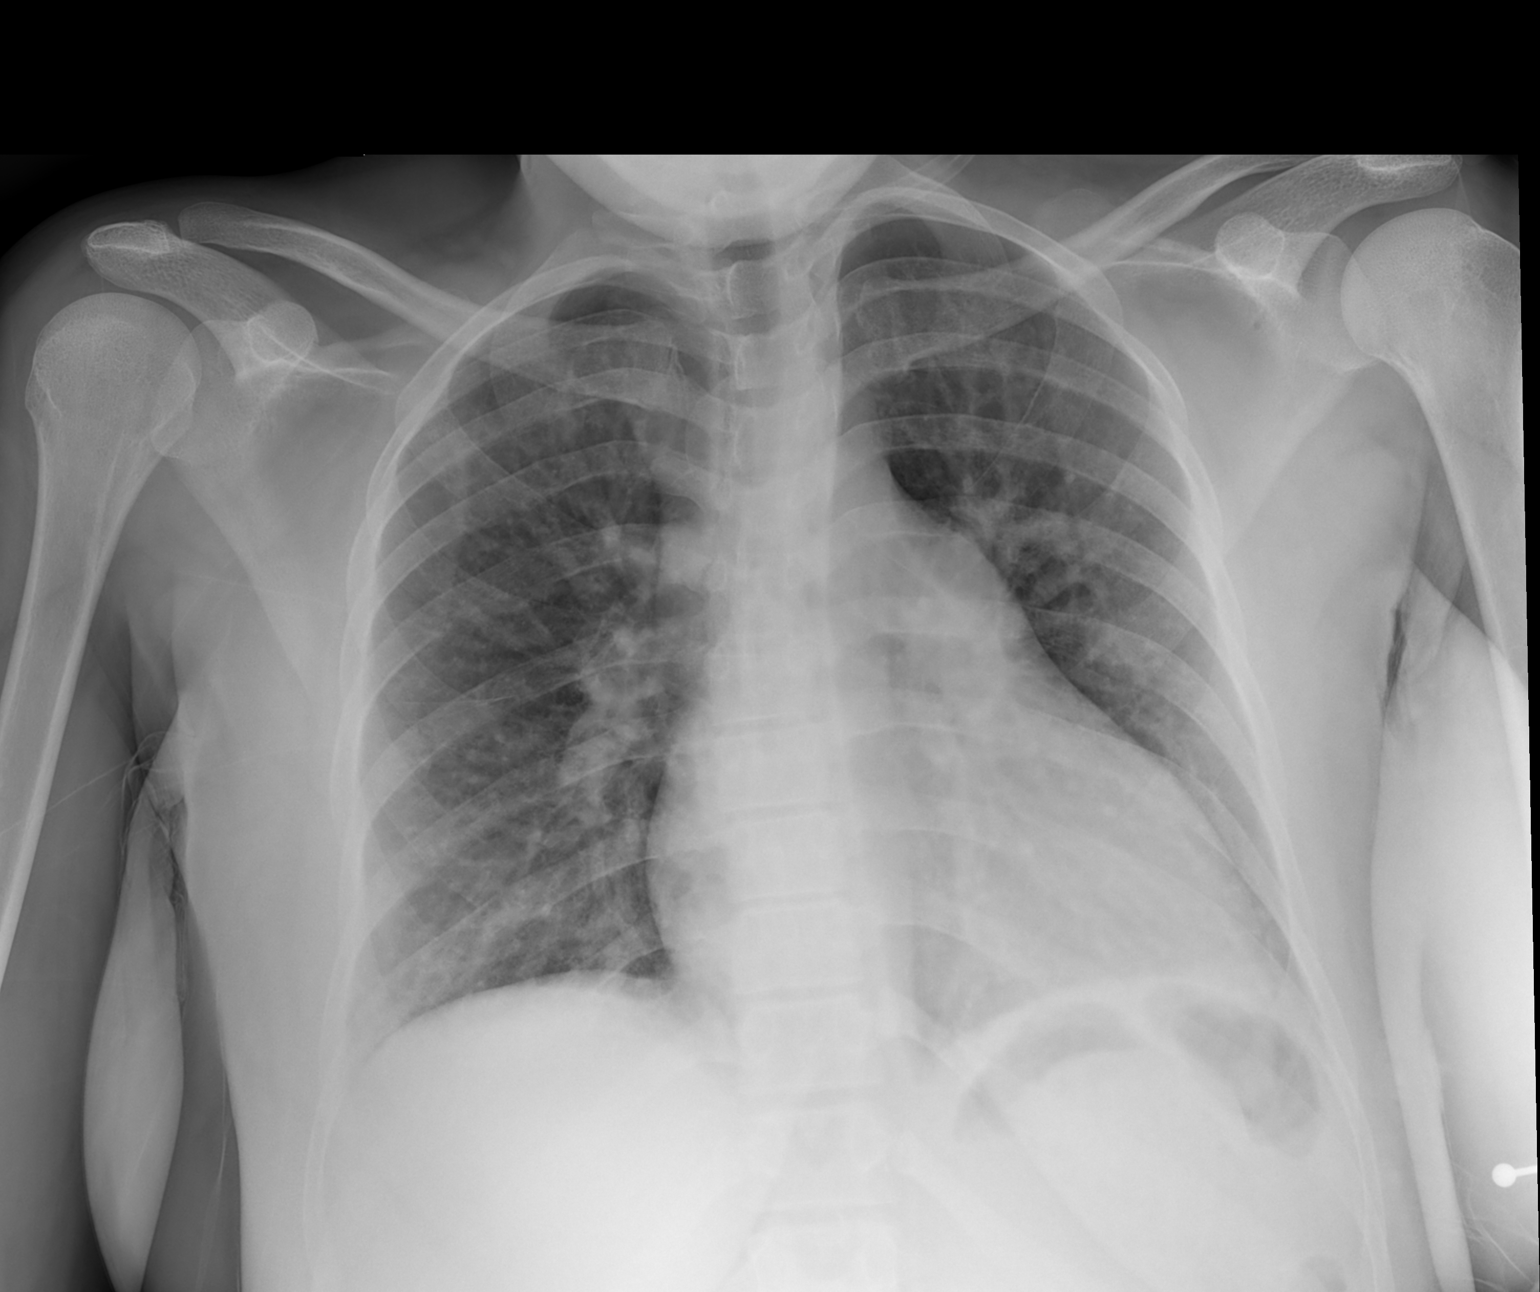

[1 of 1 positions shown; findings below may reference images not displayed]

FINDINGS: There is moderate cardiac enlargement. No pleural effusion or edema
identified. Diffuse interstitial stress set diffuse increased
interstitial prominence is identified and appears unchanged from
previous exam. There is no focal airspace consolidation noted.
IMPRESSION: 1.  No acute findings.

2. Stable cardiac enlargement and interstitial prominence.

## 2014-06-01 ENCOUNTER — Emergency Department (HOSPITAL_COMMUNITY)
Admission: EM | Admit: 2014-06-01 | Discharge: 2014-06-01 | Discharge status: Against Medical Advice | Payer: Medicaid Other | Attending: Emergency Medicine | Admitting: Emergency Medicine

## 2014-06-01 ENCOUNTER — Emergency Department (HOSPITAL_COMMUNITY): Payer: Medicaid Other

## 2014-06-01 ENCOUNTER — Encounter (HOSPITAL_COMMUNITY): Payer: Self-pay | Admitting: Oncology

## 2014-06-01 DIAGNOSIS — Z79899 Other long term (current) drug therapy: Secondary | ICD-10-CM | POA: Insufficient documentation

## 2014-06-01 DIAGNOSIS — R0902 Hypoxemia: Secondary | ICD-10-CM | POA: Insufficient documentation

## 2014-06-01 DIAGNOSIS — Z8739 Personal history of other diseases of the musculoskeletal system and connective tissue: Secondary | ICD-10-CM | POA: Diagnosis not present

## 2014-06-01 DIAGNOSIS — Z3202 Encounter for pregnancy test, result negative: Secondary | ICD-10-CM | POA: Insufficient documentation

## 2014-06-01 DIAGNOSIS — D5700 Hb-SS disease with crisis, unspecified: Secondary | ICD-10-CM | POA: Insufficient documentation

## 2014-06-01 DIAGNOSIS — R011 Cardiac murmur, unspecified: Secondary | ICD-10-CM | POA: Diagnosis not present

## 2014-06-01 LAB — COMPREHENSIVE METABOLIC PANEL
ALT: 26 U/L (ref 0–35)
AST: 75 U/L — AB (ref 0–37)
Albumin: 4 g/dL (ref 3.5–5.2)
Alkaline Phosphatase: 66 U/L (ref 39–117)
Anion gap: 8 (ref 5–15)
BUN: 10 mg/dL (ref 6–23)
CO2: 23 mmol/L (ref 19–32)
Calcium: 8.8 mg/dL (ref 8.4–10.5)
Chloride: 106 mmol/L (ref 96–112)
Creatinine, Ser: 0.35 mg/dL — ABNORMAL LOW (ref 0.50–1.10)
GFR calc Af Amer: >90 mL/min (ref >90)
Glucose, Bld: 99 mg/dL (ref 70–99)
Potassium: 4.1 mmol/L (ref 3.5–5.1)
SODIUM: 137 mmol/L (ref 135–145)
Total Bilirubin: 3.9 mg/dL — ABNORMAL HIGH (ref 0.3–1.2)
Total Protein: 7.8 g/dL (ref 6.0–8.3)

## 2014-06-01 LAB — CBC WITH DIFFERENTIAL/PLATELET
Basophils Absolute: 0 10*3/uL (ref 0.0–0.1)
Basophils Relative: 0 % (ref 0–1)
Eosinophils Absolute: 0 10*3/uL (ref 0.0–0.7)
Eosinophils Relative: 0 % (ref 0–5)
HEMATOCRIT: 17.9 % — AB (ref 36.0–46.0)
Hemoglobin: 6.6 g/dL — CL (ref 12.0–15.0)
Lymphocytes Relative: 30 % (ref 12–46)
Lymphs Abs: 5 10*3/uL — ABNORMAL HIGH (ref 0.7–4.0)
MCH: 33.5 pg (ref 26.0–34.0)
MCHC: 36.9 g/dL — ABNORMAL HIGH (ref 30.0–36.0)
MCV: 90.9 fL (ref 78.0–100.0)
Monocytes Absolute: 0.8 10*3/uL (ref 0.1–1.0)
Monocytes Relative: 5 % (ref 3–12)
Neutro Abs: 10.8 10*3/uL — ABNORMAL HIGH (ref 1.7–7.7)
Neutrophils Relative %: 65 % (ref 43–77)
Platelets: 296 10*3/uL (ref 150–400)
RBC: 1.97 MIL/uL — ABNORMAL LOW (ref 3.87–5.11)
RDW: 22.1 % — AB (ref 11.5–15.5)
WBC: 16.6 10*3/uL — ABNORMAL HIGH (ref 4.0–10.5)

## 2014-06-01 LAB — RETICULOCYTES: RBC.: 1.97 MIL/uL — ABNORMAL LOW (ref 3.87–5.11)

## 2014-06-01 LAB — POC URINE PREG, ED: PREG TEST UR: NEGATIVE

## 2014-06-01 MED ORDER — DIPHENHYDRAMINE HCL 50 MG/ML IJ SOLN
12.5000 mg | INTRAMUSCULAR | Status: DC | PRN
  Filled 2014-06-01: qty 1

## 2014-06-01 MED ORDER — SODIUM CHLORIDE 0.9 % IV SOLN
1000.0000 mL | INTRAVENOUS | Status: DC
  Administered 2014-06-01: 1000 mL via INTRAVENOUS

## 2014-06-01 MED ORDER — ONDANSETRON HCL 4 MG/2ML IJ SOLN
4.0000 mg | INTRAMUSCULAR | Status: DC | PRN
  Filled 2014-06-01: qty 2

## 2014-06-01 MED ORDER — HYDROMORPHONE HCL 2 MG/ML IJ SOLN
2.0000 mg | INTRAMUSCULAR | Status: DC | PRN
  Filled 2014-06-01: qty 1

## 2014-06-01 MED ORDER — IOHEXOL 350 MG/ML SOLN
100.0000 mL | Freq: Once | INTRAVENOUS | Status: AC | PRN
  Administered 2014-06-01: 100 mL via INTRAVENOUS

## 2014-06-01 NOTE — ED Notes (Signed)
Bed: [id number] Expected date:  Expected time:  Means of arrival:  Comments: EMS 24yo sickle cell crisis

## 2014-06-01 NOTE — ED Notes (Signed)
Pt now requesting pain medication, however, pt unable to stay awake and is slurring her words.  This Clinical research associatewriter does not feel comfortable giving narcotic pain meds at this point.  Spoke with Dr. Gwendolyn GrantWalden who agrees with this plan.

## 2014-06-01 NOTE — Discharge Instructions (Signed)
Sickle Cell Anemia, Adult °Sickle cell anemia is a condition in which red blood cells have an abnormal "sickle" shape. This abnormal shape shortens the cells' life span, which results in a lower than normal concentration of red blood cells in the blood. The sickle shape also causes the cells to clump together and block free blood flow through the blood vessels. As a result, the tissues and organs of the body do not receive enough oxygen. Sickle cell anemia causes organ damage and pain and increases the risk of infection. °CAUSES  °Sickle cell anemia is a genetic disorder. Those who receive two copies of the gene have the condition, and those who receive one copy have the trait. °RISK FACTORS °The sickle cell gene is most common in people whose families originated in [place]. Other areas of the globe where sickle cell trait occurs include the Mediterranean, [place] and [place], the [place], and the [place].  °SIGNS AND SYMPTOMS °· Pain, especially in the extremities, back, chest, or abdomen (common). The pain may start suddenly or may develop following an illness, especially if there is dehydration. Pain can also occur due to overexertion or exposure to extreme temperature changes. °· Frequent severe bacterial infections, especially certain types of pneumonia and meningitis. °· Pain and swelling in the hands and feet. °· Decreased activity.   °· Loss of appetite.   °· Change in behavior. °· Headaches. °· Seizures. °· Shortness of breath or difficulty breathing. °· Vision changes. °· Skin ulcers. °Those with the trait may not have symptoms or they may have mild symptoms.  °DIAGNOSIS  °Sickle cell anemia is diagnosed with blood tests that demonstrate the genetic trait. It is often diagnosed during the newborn period, due to mandatory testing nationwide. A variety of blood tests, X-rays, CT scans, MRI scans, ultrasounds, and lung function tests may also be done to monitor the condition. °TREATMENT  °Sickle  cell anemia may be treated with: °· Medicines. You may be given pain medicines, antibiotic medicines (to treat and prevent infections) or medicines to increase the production of certain types of hemoglobin. °· Fluids. °· Oxygen. °· Blood transfusions. °HOME CARE INSTRUCTIONS  °· Drink enough fluid to keep your urine clear or pale yellow. Increase your fluid intake in hot weather and during exercise. °· Do not smoke. Smoking lowers oxygen levels in the blood.   °· Only take over-the-counter or prescription medicines for pain, fever, or discomfort as directed by your health care provider. °· Take antibiotics as directed by your health care provider. Make sure you finish them it even if you start to feel better.   °· Take supplements as directed by your health care provider.   °· Consider wearing a medical alert bracelet. This tells anyone caring for you in an emergency of your condition.   °· When traveling, keep your medical information, health care provider's names, and the medicines you take with you at all times.   °· If you develop a fever, do not take medicines to reduce the fever right away. This could cover up a problem that is developing. Notify your health care provider. °· Keep all follow-up appointments with your health care provider. Sickle cell anemia requires regular medical care. °SEEK MEDICAL CARE IF: ° You have a fever. °SEEK IMMEDIATE MEDICAL CARE IF:  °· You feel dizzy or faint.   °· You have new abdominal pain, especially on the left side near the stomach area.   °· You develop a persistent, often uncomfortable and painful penile erection (priapism). If this is not treated immediately it   will lead to impotence.   °· You have numbness your arms or legs or you have a hard time moving them.   °· You have a hard time with speech.   °· You have a fever or persistent symptoms for more than 2-3 days.   °· You have a fever and your symptoms suddenly get worse.   °· You have signs or symptoms of infection.  These include:   °¨ Chills.   °¨ Abnormal tiredness (lethargy).   °¨ Irritability.   °¨ Poor eating.   °¨ Vomiting.   °· You develop pain that is not helped with medicine.   °· You develop shortness of breath. °· You have pain in your chest.   °· You are coughing up pus-like or bloody sputum.   °· You develop a stiff neck. °· Your feet or hands swell or have pain. °· Your abdomen appears bloated. °· You develop joint pain. °MAKE SURE YOU: °· Understand these instructions. °Document Released: 06/15/2005 Document Revised: 07/22/2013 Document Reviewed: 10/17/2012 °ExitCare® Patient Information ©2015 [employer]. This information is not intended to replace advice given to you by your health care provider. Make sure you discuss any questions you have with your health care provider. ° °

## 2014-06-01 NOTE — ED Notes (Signed)
Per EMS pt began to have pain similar to her sickle cell crisis pain in her back, chest and knees.  Pt is A&O x 4.  Pain became unbearable at approximately 0300.

## 2014-06-01 NOTE — ED Provider Notes (Signed)
CSN: [id number]     Arrival date & time 06/01/14  [id number] History   First MD Initiated Contact with Patient 06/01/14 [phone]     Chief Complaint  Patient presents with  . Sickle Cell Pain Crisis     (Consider location/radiation/quality/duration/timing/severity/associated sxs/prior Treatment) Patient is a 24 y.o. female presenting with sickle cell pain. The history is provided by the patient.  Sickle Cell Pain Crisis Location:  Chest and lower extremity (lower extremity joints) Severity:  Moderate Onset quality:  Gradual Duration:  2 days Similar to previous crisis episodes: yes   Timing:  Constant Progression:  Unchanged Chronicity:  Recurrent Sickle cell genotype:  SS Usual hemoglobin level:  6.5-7 History of pulmonary emboli: no   Context: stress (in law school, about to take exams)   Relieved by:  Nothing Worsened by:  Nothing tried Ineffective treatments:  Prescription drugs Associated symptoms: chest pain   Associated symptoms: no cough, no fever, no shortness of breath and no wheezing     Past Medical History  Diagnosis Date  . Sickle cell anemia   . 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 Lupus   . Heart murmur   . Sickle cell anemia 05/06/2012   Past Surgical History  Procedure Laterality Date  . Cholecystectomy     Family History  Problem Relation Age of Onset  . Sickle cell trait Mother   . Sickle cell trait Father   . Breast cancer Maternal Aunt     X 2  . Cancer Maternal Grandfather     Pancreatic Cancer  . Diabetes Maternal Aunt    History  Substance Use Topics  . Smoking status: Never Smoker   . Smokeless tobacco: Never Used     Comment: never used tobacco  . Alcohol Use: No   OB History    No data available     Review of Systems  Constitutional: Negative for fever.  Respiratory: Negative for cough, shortness of breath and wheezing.   Cardiovascular: Positive for chest pain.  All other systems reviewed and are negative.     Allergies   Ibuprofen  Home Medications   Prior to Admission medications   Medication Sig Start Date End Date Taking? Authorizing Provider  folic acid (FOLVITE) 1 MG tablet Take 1 mg by mouth daily.   Yes Historical Provider, MD  HYDROmorphone (DILAUDID) 4 MG tablet Take 4 mg by mouth every 4 (four) hours as needed for severe pain.   Yes Historical Provider, MD  ibuprofen (ADVIL,MOTRIN) 800 MG tablet Take 800 mg by mouth every 8 (eight) hours as needed for moderate pain.   Yes Historical Provider, MD  SUMAtriptan (IMITREX) 50 MG tablet Take 50 mg by mouth every 2 (two) hours as needed for migraine or headache. May repeat in 2 hours if headache persists or recurs.   Yes Historical Provider, MD  valACYclovir (VALTREX) 500 MG tablet Take 500 mg by mouth 2 (two) times daily as needed (outbreaks).   Yes Historical Provider, MD   BP 111/61 mmHg  Pulse 102  Temp(Src) 97.8 F (36.6 C) (Oral)  Resp 16  SpO2 96%  LMP 05/24/2014 (Approximate) Physical Exam  Constitutional: She is oriented to person, place, and time. She appears well-developed and well-nourished. No distress.  HENT:  Head: Normocephalic and atraumatic.  Mouth/Throat: Oropharynx is clear and moist.  Eyes: EOM are normal. Pupils are equal, round, and reactive to light.  Neck: Normal range of motion. Neck supple.  Cardiovascular: Normal rate and regular  rhythm.  Exam reveals no friction rub.   No murmur heard. Pulmonary/Chest: Effort normal and breath sounds normal. No respiratory distress. She has no wheezes. She has no rales.  Abdominal: Soft. She exhibits no distension. There is no tenderness. There is no rebound.  Musculoskeletal: Normal range of motion. She exhibits no edema.  No swollen joints   Neurological: She is alert and oriented to person, place, and time. No cranial nerve deficit. She exhibits normal muscle tone. Coordination normal.  Normal sensation  Skin: She is not diaphoretic.  Nursing note and vitals reviewed.   ED  Course  Procedures (including critical care time) Labs Review Labs Reviewed  CBC WITH DIFFERENTIAL/PLATELET  COMPREHENSIVE METABOLIC PANEL  RETICULOCYTES    Imaging Review Dg Chest 2 View  06/01/2014   CLINICAL DATA:  Sickle cell crisis  EXAM: CHEST  2 VIEW  COMPARISON:  12/16/2013  FINDINGS: Heart size is moderately enlarged. Coarsened interstitial markings and scattered areas of scarring are again noted bilaterally. There is pulmonary vascular congestion. No overt edema. No plural effusion.  IMPRESSION: 1. Cardiac enlargement and chronic interstitial changes compatible with the history of sickle cell disease.   Electronically Signed   By: Signa Kell M.D.   On: 06/01/2014 07:28   Ct Angio Chest Pe W/cm &/or Wo Cm  06/01/2014   CLINICAL DATA:  Sickle cell crisis. Pain in back chest in knees. Evaluate for pulmonary embolus.  EXAM: CT ANGIOGRAPHY CHEST WITH CONTRAST  TECHNIQUE: Multidetector CT imaging of the chest was performed using the standard protocol during bolus administration of intravenous contrast. Multiplanar CT image reconstructions and MIPs were obtained to evaluate the vascular anatomy.  CONTRAST:  OMNIPAQUE IOHEXOL 350 MG/ML SOLN  COMPARISON:  02/09/2013  FINDINGS: Mediastinum: Moderate cardiac enlargement is identified. No pericardial effusion identified. Soft tissue within the anterior mediastinum likely represents thymus tissue. The trachea is patent and midline. Normal appearance of the esophagus. No mediastinal or hilar adenopathy. Prominent bilateral axillary lymph nodes identified. No adenopathy. The pulmonary artery measures 4 cm in diameter. No central filling defect identified to suggest acute pulmonary embolus. No lobar or segmental pulmonary artery filling defects identified.  Lungs/Pleura: No pleural effusion identified. Increased pulmonary vascularity is identified in both lungs. There is dependent changes identified within the lung bases posteriorly. No airspace  consolidation or atelectasis.  Upper Abdomen: The visualized portions of the liver and spleen appear normal.  Musculoskeletal: Review of the visualized osseous structures shows chronic diffuse sclerosus compatible with the history of sickle cell disease.  Review of the MIP images confirms the above findings.  IMPRESSION: 1. Negative for acute pulmonary embolus. 2. 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 3. Bibasilar dependent changes.   Electronically Signed   By: Signa Kell M.D.   On: 06/01/2014 10:50     EKG Interpretation   Date/Time:  Sunday June 01 2014 06:32:26 EDT Ventricular Rate:  100 PR Interval:  188 QRS Duration: 79 QT Interval:  345 QTC Calculation: 445 R Axis:   59 Text Interpretation:  Sinus tachycardia No significant change since last  tracing Confirmed by Gwendolyn Grant  MD, Irl Bodie ([id number]) on 06/01/2014 7:12:42 AM      MDM   Final diagnoses:  Sickle cell pain crisis  Hypoxia    26F here with sickle cell pain crisis. Not a frequent flyer. 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 No fevers, no SOB. Normal pain crisis with chest pain, joint pains. Having similar pains today.  Unable to control with pain meds at home. Will start with labs, give pain meds, fluids. Patient states normal Hgb level around 6.5-7.  Hgb today 6.6, she states this is normal. Feeling better with meds, however she is hypoxic on room air down to the low 80s. PE scan ordered.  PE scan negative for PE. 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 She is still hypoxic and I recommended admission. Patient refusing admission, states she feels better and wants to go. I cannot convince her to stay. I called Oncology to call patient to ensure she gets expedited f/u since she is leaving AMA with hypoxia. Encouraged to come back if anything worsens.  Elwin Mocha, MD 06/01/14 [phone]

## 2014-06-02 ENCOUNTER — Other Ambulatory Visit: Payer: Self-pay | Admitting: Hematology & Oncology

## 2014-06-02 ENCOUNTER — Telehealth: Payer: Self-pay | Admitting: Hematology & Oncology

## 2014-06-02 DIAGNOSIS — D571 Sickle-cell disease without crisis: Secondary | ICD-10-CM

## 2014-06-02 DIAGNOSIS — D500 Iron deficiency anemia secondary to blood loss (chronic): Secondary | ICD-10-CM

## 2014-06-02 NOTE — Telephone Encounter (Signed)
Pt aware of 3-16 appointment

## 2014-06-04 ENCOUNTER — Ambulatory Visit: Payer: Medicaid Other | Admitting: Family

## 2014-06-04 ENCOUNTER — Telehealth: Payer: Self-pay | Admitting: Hematology & Oncology

## 2014-06-04 ENCOUNTER — Other Ambulatory Visit: Payer: Medicaid Other | Admitting: Lab

## 2014-06-04 NOTE — Telephone Encounter (Signed)
Pt stated she forgot about appointment today and rescheduled for friday

## 2014-06-06 ENCOUNTER — Other Ambulatory Visit (HOSPITAL_BASED_OUTPATIENT_CLINIC_OR_DEPARTMENT_OTHER): Payer: Medicaid Other | Admitting: Lab

## 2014-06-06 ENCOUNTER — Ambulatory Visit (HOSPITAL_BASED_OUTPATIENT_CLINIC_OR_DEPARTMENT_OTHER): Payer: Medicaid Other | Admitting: Family

## 2014-06-06 ENCOUNTER — Encounter: Payer: Self-pay | Admitting: Family

## 2014-06-06 VITALS — BP 107/51 | HR 83 | Temp 97.9°F | Resp 14 | Ht 67.0 in | Wt 160.0 lb

## 2014-06-06 DIAGNOSIS — M329 Systemic lupus erythematosus, unspecified: Secondary | ICD-10-CM

## 2014-06-06 DIAGNOSIS — D571 Sickle-cell disease without crisis: Secondary | ICD-10-CM | POA: Diagnosis not present

## 2014-06-06 DIAGNOSIS — D500 Iron deficiency anemia secondary to blood loss (chronic): Secondary | ICD-10-CM

## 2014-06-06 LAB — CBC WITH DIFFERENTIAL (CANCER CENTER ONLY)
BASO#: 0 10*3/uL (ref 0.0–0.2)
BASO%: 0.1 % (ref 0.0–2.0)
EOS%: 0.7 % (ref 0.0–7.0)
Eosinophils Absolute: 0.1 10*3/uL (ref 0.0–0.5)
HCT: 19.3 % — ABNORMAL LOW (ref 34.8–46.6)
HGB: 7 g/dL — ABNORMAL LOW (ref 11.6–15.9)
LYMPH#: 4.5 10*3/uL — AB (ref 0.9–3.3)
LYMPH%: 37.7 % (ref 14.0–48.0)
MCH: 33.3 pg (ref 26.0–34.0)
MCHC: 36.3 g/dL — ABNORMAL HIGH (ref 32.0–36.0)
MCV: 92 fL (ref 81–101)
MONO#: 0.6 10*3/uL (ref 0.1–0.9)
MONO%: 5.1 % (ref 0.0–13.0)
NEUT#: 6.8 10*3/uL — ABNORMAL HIGH (ref 1.5–6.5)
NEUT%: 56.4 % (ref 39.6–80.0)
Platelets: 341 10*3/uL (ref 145–400)
RBC: 2.1 10*6/uL — ABNORMAL LOW (ref 3.70–5.32)
RDW: 22.7 % — AB (ref 11.1–15.7)
WBC: 12 10*3/uL — ABNORMAL HIGH (ref 3.9–10.0)

## 2014-06-06 LAB — IRON AND TIBC CHCC
%SAT: 25 % (ref 21–57)
Iron: 90 ug/dL (ref 41–142)
TIBC: 360 ug/dL (ref 236–444)
UIBC: 270 ug/dL (ref 120–384)

## 2014-06-06 LAB — CHCC SATELLITE - SMEAR

## 2014-06-06 LAB — TECHNOLOGIST REVIEW CHCC SATELLITE

## 2014-06-06 LAB — HOLD TUBE, BLOOD BANK - CHCC SATELLITE

## 2014-06-06 LAB — FERRITIN CHCC: Ferritin: 140 ng/ml (ref 9–269)

## 2014-06-06 MED ORDER — IBUPROFEN 800 MG PO TABS: 800.0000 mg | ORAL_TABLET | Freq: Three times a day (TID) | ORAL | Status: DC | PRN

## 2014-06-06 NOTE — Progress Notes (Signed)
Hematology and Oncology Follow Up Visit  [name] [id number] 04-20-1990 24 y.o. 06/06/2014  Principle Diagnosis:  Hemoglobin SS disease.  Lupus  Current Therapy:   Folic acid 2 mg p.o. q. day    Interim History: [name] is here today for a follow-up. She had a crisis on Sunday and went to the ED for fluids and pain management. She is feeling much better but is still tired at times. She has some SOB with exertion.   She has had no more numbness or tingling in her left arm.  Her headaches are improving and she is having less of them.  She denies fever, chills, n/v, cough, rash, dizziness, SOB, chest pain, palpitations, constipation, diarrhea, blood in urine or stool. She denies swelling, tenderness, numbness or tingling in her other extremities.  Her appetite is good and she is drinking plenty of fluids.  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 [place] [employer] [employer]  She needs to have and exchange but states that she does not want to have that done. We will have her take a baby aspirin daily for the next month along with her folic acid and see if that helps.  I reminded her to stay hydrated during her trip.  She has had no issues with her lupus. Her Hgb today is 7.0.   Medications:    Medication List       This list is accurate as of: 06/06/14 10:04 AM.  Always use your most recent med list.               folic acid 1 MG tablet  Commonly known as:  FOLVITE  Take 1 mg by mouth daily.     HYDROmorphone 4 MG tablet  Commonly known as:  DILAUDID  Take 4 mg by mouth every 4 (four) hours as needed for severe pain.     ibuprofen 800 MG tablet  Commonly known as:  ADVIL,MOTRIN  Take 800 mg by mouth every 8 (eight) hours as needed for moderate pain.     SUMAtriptan 50 MG tablet  Commonly known as:  IMITREX  Take 50 mg by mouth every 2 (two) hours as needed for migraine or headache.  May repeat in 2 hours if headache persists or recurs.     valACYclovir 500 MG tablet  Commonly known as:  VALTREX  Take 500 mg by mouth 2 (two) times daily as needed (outbreaks).        Allergies:  Allergies  Allergen Reactions  . Ibuprofen Other (See Comments)    Due to other medications she takes for Lupus    Past Medical History, Surgical history, Social history, and Family History were reviewed and updated.  Review of Systems: All other 10 point review of systems is negative.   Physical Exam:  height is 5\' 7"  (1.702 m) and weight is 160 lb (72.576 kg). Her oral temperature is 97.9 F (36.6 C). Her blood pressure is 107/51 and her pulse is 83. Her respiration is 14.   Wt Readings from Last 3 Encounters:  06/06/14 160 lb (72.576 kg)  12/11/13 157 lb (71.215 kg)  02/08/13 155 lb (70.308 kg)    Ocular: Sclerae unicteric, pupils equal, round and reactive to light Ear-nose-throat: Oropharynx clear, dentition fair Lymphatic: No cervical or supraclavicular adenopathy Lungs no rales or rhonchi, good excursion bilaterally Heart regular rate and  rhythm, no murmur appreciated Abd soft, nontender, positive bowel sounds MSK no focal spinal tenderness, no joint edema Neuro: non-focal, well-oriented, appropriate affect Breasts: Deferred  Lab Results  Component Value Date   WBC 16.6* 06/01/2014   HGB 6.6* 06/01/2014   HCT 17.9* 06/01/2014   MCV 90.9 06/01/2014   PLT 296 06/01/2014   Lab Results  Component Value Date   FERRITIN 113 12/11/2013   IRON 112 12/11/2013   TIBC 348 12/11/2013   UIBC 236 12/11/2013   IRONPCTSAT 32 12/11/2013   Lab Results  Component Value Date   RETICCTPCT >23.0* 06/01/2014   RBC 1.97* 06/01/2014   RBC 1.97* 06/01/2014   RETICCTABS 372.3* 12/11/2013   No results found for: KPAFRELGTCHN, LAMBDASER, KAPLAMBRATIO No results found for: IGGSERUM, IGA, IGMSERUM No results found for: Marda Stalker, SPEI   Chemistry      Component Value Date/Time   NA 137 06/01/2014 0650   K 4.1 06/01/2014 0650   CL 106 06/01/2014 0650   CO2 23 06/01/2014 0650   BUN 10 06/01/2014 0650   CREATININE 0.35* 06/01/2014 0650      Component Value Date/Time   CALCIUM 8.8 06/01/2014 0650   ALKPHOS 66 06/01/2014 0650   AST 75* 06/01/2014 0650   ALT 26 06/01/2014 0650   BILITOT 3.9* 06/01/2014 0650     Impression and Plan: [name] is a very pleasant 24 year old African American female with hemoglobin SS disease. She also has lupus. She had a crisis last Sunday and went to the ED for fluids and pain management. She is feeling much better now but still has some fatigue.  She refused an exchange so we will have her start taking a baby aspirin with her folic acid for the next month and see is that helps.   Her Hgb today was 7.0. We will see what her iron studies show.  We will see her back in 6 months for labs and follow-up.   She knows to call here with any questions or concerns and to go to the ED in the event of an emergency. She understands that we can see her sooner if need be.   Verdie Mosher, NP 3/18/201610:04 AM

## 2014-06-10 LAB — RETICULOCYTES (CHCC)
ABS RETIC: 429.7 10*3/uL — AB (ref 19.0–186.0)
RBC.: 2.17 MIL/uL — ABNORMAL LOW (ref 3.87–5.11)
Retic Ct Pct: 19.8 % — ABNORMAL HIGH (ref 0.4–2.3)

## 2014-06-10 LAB — HEMOGLOBINOPATHY EVALUATION
HGB A: 0 % — AB (ref 96.8–97.8)
Hemoglobin Other: 0 %
Hgb A2 Quant: 2.9 % (ref 2.2–3.2)
Hgb F Quant: 7.9 % — ABNORMAL HIGH (ref 0.0–2.0)
Hgb S Quant: 89.2 % — ABNORMAL HIGH

## 2014-06-10 LAB — COMPREHENSIVE METABOLIC PANEL
ALT: 22 U/L (ref 0–35)
AST: 58 U/L — ABNORMAL HIGH (ref 0–37)
Albumin: 3.6 g/dL (ref 3.5–5.2)
Alkaline Phosphatase: 73 U/L (ref 39–117)
BILIRUBIN TOTAL: 3.3 mg/dL — AB (ref 0.2–1.2)
BUN: 8 mg/dL (ref 6–23)
CO2: 22 meq/L (ref 19–32)
Calcium: 9.1 mg/dL (ref 8.4–10.5)
Chloride: 105 mEq/L (ref 96–112)
Creatinine, Ser: 0.4 mg/dL — ABNORMAL LOW (ref 0.50–1.10)
GLUCOSE: 91 mg/dL (ref 70–99)
Potassium: 4.3 mEq/L (ref 3.5–5.3)
Sodium: 138 mEq/L (ref 135–145)
Total Protein: 8 g/dL (ref 6.0–8.3)

## 2014-10-15 ENCOUNTER — Encounter (HOSPITAL_COMMUNITY): Payer: Self-pay | Admitting: Emergency Medicine

## 2014-10-15 ENCOUNTER — Emergency Department (HOSPITAL_COMMUNITY): Payer: Medicaid Other

## 2014-10-15 ENCOUNTER — Emergency Department (HOSPITAL_COMMUNITY)
Admission: EM | Admit: 2014-10-15 | Discharge: 2014-10-15 | Disposition: A | Discharge status: Home / Self Care | Payer: Medicaid Other | Attending: Emergency Medicine | Admitting: Emergency Medicine

## 2014-10-15 DIAGNOSIS — Z8739 Personal history of other diseases of the musculoskeletal system and connective tissue: Secondary | ICD-10-CM | POA: Diagnosis not present

## 2014-10-15 DIAGNOSIS — R51 Headache: Secondary | ICD-10-CM | POA: Insufficient documentation

## 2014-10-15 DIAGNOSIS — R011 Cardiac murmur, unspecified: Secondary | ICD-10-CM | POA: Insufficient documentation

## 2014-10-15 DIAGNOSIS — D638 Anemia in other chronic diseases classified elsewhere: Secondary | ICD-10-CM | POA: Diagnosis not present

## 2014-10-15 DIAGNOSIS — H53149 Visual discomfort, unspecified: Secondary | ICD-10-CM | POA: Diagnosis not present

## 2014-10-15 DIAGNOSIS — R519 Headache, unspecified: Secondary | ICD-10-CM

## 2014-10-15 DIAGNOSIS — Z79899 Other long term (current) drug therapy: Secondary | ICD-10-CM | POA: Diagnosis not present

## 2014-10-15 LAB — TYPE AND SCREEN
ABO/RH(D): A POS
ANTIBODY SCREEN: NEGATIVE

## 2014-10-15 LAB — COMPREHENSIVE METABOLIC PANEL
ALBUMIN: 3.9 g/dL (ref 3.5–5.0)
ALT: 18 U/L (ref 14–54)
AST: 59 U/L — ABNORMAL HIGH (ref 15–41)
Alkaline Phosphatase: 62 U/L (ref 38–126)
Anion gap: 6 (ref 5–15)
BUN: 8 mg/dL (ref 6–20)
CALCIUM: 8.8 mg/dL — AB (ref 8.9–10.3)
CHLORIDE: 108 mmol/L (ref 101–111)
CO2: 23 mmol/L (ref 22–32)
Creatinine, Ser: 0.47 mg/dL (ref 0.44–1.00)
GFR calc Af Amer: >60 mL/min (ref >60)
Glucose, Bld: 84 mg/dL (ref 65–99)
POTASSIUM: 3.6 mmol/L (ref 3.5–5.1)
SODIUM: 137 mmol/L (ref 135–145)
Total Bilirubin: 3 mg/dL — ABNORMAL HIGH (ref 0.3–1.2)
Total Protein: 7.6 g/dL (ref 6.5–8.1)

## 2014-10-15 LAB — RETICULOCYTES: RBC.: 1.8 MIL/uL — AB (ref 3.87–5.11)

## 2014-10-15 LAB — CBC WITH DIFFERENTIAL/PLATELET
Band Neutrophils: 0 % (ref 0–10)
Basophils Absolute: 0 10*3/uL (ref 0.0–0.1)
Basophils Relative: 0 % (ref 0–1)
Blasts: 0 %
Eosinophils Absolute: 0.1 10*3/uL (ref 0.0–0.7)
Eosinophils Relative: 1 % (ref 0–5)
HCT: 15.9 % — ABNORMAL LOW (ref 36.0–46.0)
Hemoglobin: 5.8 g/dL — CL (ref 12.0–15.0)
Lymphocytes Relative: 32 % (ref 12–46)
Lymphs Abs: 4.8 10*3/uL — ABNORMAL HIGH (ref 0.7–4.0)
MCH: 32.2 pg (ref 26.0–34.0)
MCHC: 36.5 g/dL — ABNORMAL HIGH (ref 30.0–36.0)
MCV: 88.3 fL (ref 78.0–100.0)
MONO ABS: 0.9 10*3/uL (ref 0.1–1.0)
Metamyelocytes Relative: 0 %
Monocytes Relative: 6 % (ref 3–12)
Myelocytes: 0 %
NEUTROS ABS: 9.1 10*3/uL — AB (ref 1.7–7.7)
NRBC: 0 /100{WBCs}
Neutrophils Relative %: 61 % (ref 43–77)
Other: 0 %
PROMYELOCYTES ABS: 0 %
Platelets: 264 10*3/uL (ref 150–400)
RBC: 1.8 MIL/uL — ABNORMAL LOW (ref 3.87–5.11)
RDW: 24.2 % — ABNORMAL HIGH (ref 11.5–15.5)
WBC: 14.9 10*3/uL — AB (ref 4.0–10.5)

## 2014-10-15 MED ORDER — SODIUM CHLORIDE 0.9 % IV BOLUS (SEPSIS)
1000.0000 mL | Freq: Once | INTRAVENOUS | Status: AC
  Administered 2014-10-15: 1000 mL via INTRAVENOUS

## 2014-10-15 MED ORDER — METOCLOPRAMIDE HCL 5 MG/ML IJ SOLN
10.0000 mg | Freq: Once | INTRAMUSCULAR | Status: AC
  Administered 2014-10-15: 10 mg via INTRAVENOUS
  Filled 2014-10-15: qty 2

## 2014-10-15 MED ORDER — DIPHENHYDRAMINE HCL 50 MG/ML IJ SOLN
25.0000 mg | Freq: Once | INTRAMUSCULAR | Status: AC
  Administered 2014-10-15: 25 mg via INTRAVENOUS
  Filled 2014-10-15: qty 1

## 2014-10-15 NOTE — ED Notes (Signed)
Critical lab, notified from lab, RN notified.

## 2014-10-15 NOTE — ED Notes (Signed)
Pt has hx of sickle cell, lupus and migraine. Today states she has a migraine. Took only  Ibuprofen at home. Has imitrex but did not take it. Alert and oriented.

## 2014-10-15 NOTE — Discharge Instructions (Signed)
Please follow up with your primary care physician in 1-2 days. If you do not have one please call the [facility] number listed above. Please read all discharge instructions and return precautions.  ° °General Headache Without Cause °A headache is pain or discomfort felt around the head or neck area. The specific cause of a headache may not be found. There are many causes and types of headaches. A few common ones are: °· Tension headaches. °· Migraine headaches. °· Cluster headaches. °· Chronic daily headaches. °HOME CARE INSTRUCTIONS  °· Keep all follow-up appointments with your caregiver or any specialist referral. °· Only take over-the-counter or prescription medicines for pain or discomfort as directed by your caregiver. °· Lie down in a dark, quiet room when you have a headache. °· Keep a headache journal to find out what may trigger your migraine headaches. For example, write down: °¨ What you eat and drink. °¨ How much sleep you get. °¨ Any change to your diet or medicines. °· Try massage or other relaxation techniques. °· Put ice packs or heat on the head and neck. Use these 3 to 4 times per day for 15 to 20 minutes each time, or as needed. °· Limit stress. °· Sit up straight, and do not tense your muscles. °· Quit smoking if you smoke. °· Limit alcohol use. °· Decrease the amount of caffeine you drink, or stop drinking caffeine. °· Eat and sleep on a regular schedule. °· Get 7 to 9 hours of sleep, or as recommended by your caregiver. °· Keep lights dim if bright lights bother you and make your headaches worse. °SEEK MEDICAL CARE IF:  °· You have problems with the medicines you were prescribed. °· Your medicines are not working. °· You have a change from the usual headache. °· You have nausea or vomiting. °SEEK IMMEDIATE MEDICAL CARE IF:  °· Your headache becomes severe. °· You have a fever. °· You have a stiff neck. °· You have loss of vision. °· You have muscular weakness or loss of  muscle control. °· You start losing your balance or have trouble walking. °· You feel faint or pass out. °· You have severe symptoms that are different from your first symptoms. °MAKE SURE YOU:  °· Understand these instructions. °· Will watch your condition. °· Will get help right away if you are not doing well or get worse. °Document Released: 03/07/2005 Document Revised: 05/30/2011 Document Reviewed: 03/23/2011 °ExitCare® Patient Information ©2015 [employer]. This information is not intended to replace advice given to you by your health care provider. Make sure you discuss any questions you have with your health care provider. ° °

## 2014-10-15 NOTE — ED Notes (Signed)
Pt to CT

## 2014-10-15 NOTE — ED Provider Notes (Signed)
CSN: [id number]     Arrival date & time 10/15/14  0240 History   First MD Initiated Contact with Patient 10/15/14 0310     Chief Complaint  Patient presents with  . Migraine     (Consider location/radiation/quality/duration/timing/severity/associated sxs/prior Treatment) HPI Comments: Patient is a 24 yo F PMHx significant for sickle cell anemia type SS disease, SLE presenting to the ED for evaluation of posterior pounding headache that awoke the patient at 1AM this morning, she attempted to take 800 mg of ibuprofen with little to no improvement. She did not take her Imitrex. She states her headache feels different from previous headaches.  denies any fevers, cough, rash or tick bites. She states that the onset of her headache she had pain all over her body that has resolved now continues with a moderate posterior headache worsens with light. No modifying factors identified. No other alleviating factors identified.   Patient is a 24 y.o. female presenting with migraines.  Migraine Associated symptoms include headaches.    Past Medical History  Diagnosis Date  . Sickle cell anemia   . 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 Lupus   . Heart murmur   . Sickle cell anemia 05/06/2012   Past Surgical History  Procedure Laterality Date  . Cholecystectomy     Family History  Problem Relation Age of Onset  . Sickle cell trait Mother   . Sickle cell trait Father   . Breast cancer Maternal Aunt     X 2  . Cancer Maternal Grandfather     Pancreatic Cancer  . Diabetes Maternal Aunt    History  Substance Use Topics  . Smoking status: Never Smoker   . Smokeless tobacco: Never Used     Comment: never used tobacco  . Alcohol Use: No   OB History    No data available     Review of Systems  Eyes: Positive for photophobia.  Neurological: Positive for headaches.  All other systems reviewed and are negative.     Allergies  Ibuprofen  Home Medications   Prior to Admission  medications   Medication Sig Start Date End Date Taking? Authorizing Provider  folic acid (FOLVITE) 1 MG tablet Take 1 mg by mouth daily.   Yes Historical Provider, MD  HYDROmorphone (DILAUDID) 4 MG tablet Take 4 mg by mouth every 4 (four) hours as needed for severe pain.   Yes Historical Provider, MD  ibuprofen (ADVIL,MOTRIN) 800 MG tablet Take 1 tablet (800 mg total) by mouth every 8 (eight) hours as needed for moderate pain. 06/06/14  Yes Verdie Mosher, NP  SUMAtriptan (IMITREX) 50 MG tablet Take 50 mg by mouth every 2 (two) hours as needed for migraine or headache. May repeat in 2 hours if headache persists or recurs.   Yes Historical Provider, MD  valACYclovir (VALTREX) 500 MG tablet Take 500 mg by mouth 2 (two) times daily as needed (outbreaks).   Yes Historical Provider, MD   BP 110/58 mmHg  Pulse 73  Temp(Src) 98.4 F (36.9 C) (Oral)  Resp 16  SpO2 90%  LMP 10/04/2014 Physical Exam  Constitutional: She is oriented to person, place, and time. She appears well-developed and well-nourished. No distress.  HENT:  Head: Normocephalic and atraumatic.  Right Ear: External ear normal.  Left Ear: External ear normal.  Nose: Nose normal.  Mouth/Throat: Oropharynx is clear and moist. No oropharyngeal exudate.  Eyes: Conjunctivae and EOM are normal. Pupils are equal, round, and reactive to light.  Neck: Normal range of motion. Neck supple.  Cardiovascular: Normal rate, regular rhythm, normal heart sounds and intact distal pulses.   Pulmonary/Chest: Effort normal and breath sounds normal. No respiratory distress.  Abdominal: Soft. There is no tenderness.  Musculoskeletal: Normal range of motion.  Neurological: She is alert and oriented to person, place, and time. She has normal strength. No cranial nerve deficit. GCS eye subscore is 4. GCS verbal subscore is 5. GCS motor subscore is 6.  Sensation grossly intact.  No pronator drift.  Bilateral heel-knee-shin intact.  Skin: Skin is warm  and dry. She is not diaphoretic.  Nursing note and vitals reviewed.   ED Course  Procedures (including critical care time) Medications  sodium chloride 0.9 % bolus 1,000 mL (1,000 mLs Intravenous New Bag/Given 10/15/14 0432)  diphenhydrAMINE (BENADRYL) injection 25 mg (25 mg Intravenous Given 10/15/14 0458)  metoCLOPramide (REGLAN) injection 10 mg (10 mg Intravenous Given 10/15/14 0458)    Labs Review Labs Reviewed  CBC WITH DIFFERENTIAL/PLATELET - Abnormal; Notable for the following:    WBC 14.9 (*)    RBC 1.80 (*)    Hemoglobin 5.8 (*)    HCT 15.9 (*)    MCHC 36.5 (*)    RDW 24.2 (*)    Neutro Abs 9.1 (*)    Lymphs Abs 4.8 (*)    All other components within normal limits  RETICULOCYTES - Abnormal; Notable for the following:    Retic Ct Pct >23.0 (*)    RBC. 1.80 (*)    All other components within normal limits  COMPREHENSIVE METABOLIC PANEL - Abnormal; Notable for the following:    Calcium 8.8 (*)    AST 59 (*)    Total Bilirubin 3.0 (*)    All other components within normal limits  TYPE AND SCREEN    Imaging Review Ct Head Wo Contrast  10/15/2014   CLINICAL DATA:  Initial evaluation for acute migraine headache. History of migraines.  EXAM: CT HEAD WITHOUT CONTRAST  TECHNIQUE: Contiguous axial images were obtained from the base of the skull through the vertex without intravenous contrast.  COMPARISON:  Prior MRI from 12/13/2013  FINDINGS: There is no acute intracranial hemorrhage or infarct. No mass lesion or midline shift. Gray-white matter differentiation is well maintained. Ventricles are normal in size without evidence of hydrocephalus. CSF containing spaces are within normal limits. No extra-axial fluid collection.  The calvarium is intact.  Orbital soft tissues are within normal limits.  The paranasal sinuses and mastoid air cells are well pneumatized and free of fluid.  Scalp soft tissues are unremarkable.  IMPRESSION: Normal head CT with no acute intracranial process  identified.   Electronically Signed   By: Rise Mu M.D.   On: 10/15/2014 05:12     EKG Interpretation None      5:25 AM Discussed Hgb results with patient. Denies any SOB, CP, melena, bright red blood per rectum, hematemesis, hemoptysis. Patient's normal hgb 6.5-7.0 Patient states she typically drops to 4.5 before receiving a transfusion. Patient is adamant that she does not have any symptoms requiring a transfusion at this time. She does not want to stay in the hospital, feels she can go home with repeat lab work by Dr. Twanna Hy in 1-2 days to ensure no further decreasing Hgb.   MDM   Final diagnoses:  Acute nonintractable headache, unspecified headache type  Anemia, chronic disease    Filed Vitals:   10/15/14 0243  BP: 110/58  Pulse: 73  Temp: 98.4 F (36.9  C)  Resp: 16   I have reviewed nursing notes, vital signs, and all lab and all imaging results as noted above.   HA: Pt HA treated and improved while in ED.  Presentation is non concerning for [facility], ICH, Meningitis, or temporal arteritis. CT scan is unremarkable. Pt is afebrile with no focal neuro deficits, nuchal rigidity, or change in vision.   Anemia: Labs reviewed. hemoglobin noted to be 5.8, lower than patient's normal 6.5-7 range. She is currently asymptomatic, no chest pain shortness of breath hypoxia evidence of GI bleed. Patient is insistent she does not have symptoms to suggest she needs a transfusion at this time. She is requesting to be discharged home. Discussed with patient the need for repeat hemoglobin check in 1-2 days to ensure it does not continue to drop at which time she may require a transfusion. She is agreeable to this plan.  Patient is agreeable to plan. Patient is stable at time of discharge  Patient d/w with Dr. Norlene Campbell, agrees with plan.    Francee Piccolo, PA-C 10/15/14 [id number]  Marisa Severin, MD 10/16/14 [phone]

## 2014-10-15 NOTE — ED Notes (Signed)
Discharge paperwork reviewed and the importance of follow up to recheck low hemoglobin level was understood. Pt will make a follow up appointment with her PCP tomorrow or Friday.

## 2014-12-05 ENCOUNTER — Encounter: Payer: Self-pay | Admitting: Hematology & Oncology

## 2014-12-05 ENCOUNTER — Other Ambulatory Visit (HOSPITAL_BASED_OUTPATIENT_CLINIC_OR_DEPARTMENT_OTHER): Payer: Medicaid Other

## 2014-12-05 ENCOUNTER — Ambulatory Visit (HOSPITAL_BASED_OUTPATIENT_CLINIC_OR_DEPARTMENT_OTHER): Payer: Medicaid Other | Admitting: Hematology & Oncology

## 2014-12-05 ENCOUNTER — Telehealth: Payer: Self-pay

## 2014-12-05 VITALS — BP 98/53 | HR 80 | Temp 98.2°F | Resp 16 | Ht 67.0 in | Wt 159.0 lb

## 2014-12-05 DIAGNOSIS — M329 Systemic lupus erythematosus, unspecified: Secondary | ICD-10-CM

## 2014-12-05 DIAGNOSIS — D571 Sickle-cell disease without crisis: Secondary | ICD-10-CM

## 2014-12-05 DIAGNOSIS — D500 Iron deficiency anemia secondary to blood loss (chronic): Secondary | ICD-10-CM

## 2014-12-05 LAB — IRON AND TIBC CHCC
%SAT: 26 % (ref 21–57)
Iron: 89 ug/dL (ref 41–142)
TIBC: 348 ug/dL (ref 236–444)
UIBC: 258 ug/dL (ref 120–384)

## 2014-12-05 LAB — CBC WITH DIFFERENTIAL (CANCER CENTER ONLY)
BASO#: 0 10*3/uL (ref 0.0–0.2)
BASO%: 0.2 % (ref 0.0–2.0)
EOS%: 0.8 % (ref 0.0–7.0)
Eosinophils Absolute: 0.1 10*3/uL (ref 0.0–0.5)
HCT: 18.4 % — ABNORMAL LOW (ref 34.8–46.6)
HEMOGLOBIN: 6.7 g/dL — AB (ref 11.6–15.9)
LYMPH#: 3.3 10*3/uL (ref 0.9–3.3)
LYMPH%: 35 % (ref 14.0–48.0)
MCH: 32.8 pg (ref 26.0–34.0)
MCHC: 36.4 g/dL — ABNORMAL HIGH (ref 32.0–36.0)
MCV: 90 fL (ref 81–101)
MONO#: 0.6 10*3/uL (ref 0.1–0.9)
MONO%: 5.8 % (ref 0.0–13.0)
NEUT#: 5.5 10*3/uL (ref 1.5–6.5)
NEUT%: 58.2 % (ref 39.6–80.0)
PLATELETS: 379 10*3/uL (ref 145–400)
RBC: 2.04 10*6/uL — AB (ref 3.70–5.32)
RDW: 21.2 % — ABNORMAL HIGH (ref 11.1–15.7)
WBC: 9.5 10*3/uL (ref 3.9–10.0)

## 2014-12-05 LAB — HOLD TUBE, BLOOD BANK - CHCC SATELLITE

## 2014-12-05 LAB — CMP (CANCER CENTER ONLY)
ALK PHOS: 79 U/L (ref 26–84)
ALT: 31 U/L (ref 10–47)
AST: 62 U/L — AB (ref 11–38)
Albumin: 3.6 g/dL (ref 3.3–5.5)
BILIRUBIN TOTAL: 3.6 mg/dL — AB (ref 0.20–1.60)
BUN, Bld: 6 mg/dL — ABNORMAL LOW (ref 7–22)
CALCIUM: 8.6 mg/dL (ref 8.0–10.3)
CO2: 24 meq/L (ref 18–33)
CREATININE: 0.5 mg/dL — AB (ref 0.6–1.2)
Chloride: 109 mEq/L — ABNORMAL HIGH (ref 98–108)
Glucose, Bld: 87 mg/dL (ref 73–118)
Potassium: 3.9 mEq/L (ref 3.3–4.7)
SODIUM: 136 meq/L (ref 128–145)
Total Protein: 7.5 g/dL (ref 6.4–8.1)

## 2014-12-05 LAB — TECHNOLOGIST REVIEW CHCC SATELLITE

## 2014-12-05 LAB — FERRITIN CHCC: Ferritin: 118 ng/ml (ref 9–269)

## 2014-12-05 LAB — CHCC SATELLITE - SMEAR

## 2014-12-05 MED ORDER — IBUPROFEN 800 MG PO TABS: 800.0000 mg | ORAL_TABLET | Freq: Three times a day (TID) | ORAL | Status: DC | PRN

## 2014-12-05 MED ORDER — SUMATRIPTAN SUCCINATE 50 MG PO TABS: 50.0000 mg | ORAL_TABLET | ORAL | Status: AC | PRN

## 2014-12-05 NOTE — Progress Notes (Signed)
Hematology and Oncology Follow Up Visit  [name] [id number] Jun 19, 1990 24 y.o. 12/05/2014  Principle Diagnosis:  Hemoglobin SS disease.  Lupus  Current Therapy:   Folic acid 2 mg p.o. q. day    Interim History: [name] is here today for a follow-up. She is doing well. 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  Since we last saw her, she's had no problems with the sickle cell. She's had no crises. I patient may have gone to the emergency room one time. She's having some migraines. She is on medication for this. It does not seem to help that much.  She's had no problems with fever. She's had no change in bowel or bladder habits. She still has her monthly cycles.  She is still taking her folic acid. She is doing very well with this.  She's had no rashes. She's had no nausea or vomiting.  Overall, her performance status is ECOG 1.  Medications:    Medication List       This list is accurate as of: 12/05/14 10:12 AM.  Always use your most recent med list.               DENTA 5000 PLUS 1.1 % Crea dental cream  Generic drug:  sodium fluoride  USE TOOTHPASTE TWICE DAILY     folic acid 1 MG tablet  Commonly known as:  FOLVITE  Take 1 mg by mouth daily.     HYDROcodone-acetaminophen 5-325 MG per tablet  Commonly known as:  NORCO/VICODIN  TAKE ONE TO TWO TABLETS BY MOUTH EVERY FOUR TO SIX HOURS AS NEEDED FOR PAIN     HYDROmorphone 4 MG tablet  Commonly known as:  DILAUDID  Take 4 mg by mouth every 4 (four) hours as needed for severe pain.     ibuprofen 800 MG tablet  Commonly known as:  ADVIL,MOTRIN  Take 1 tablet (800 mg total) by mouth every 8 (eight) hours as needed for moderate pain.     SUMAtriptan 50 MG tablet  Commonly known as:  IMITREX  Take 1 tablet (50 mg total) by mouth every 2 (two) hours as needed for migraine or headache. May repeat in 2 hours if headache persists or recurs.     valACYclovir 500 MG tablet  Commonly known as:   VALTREX  Take 500 mg by mouth 2 (two) times daily as needed (outbreaks).        Allergies:  Allergies  Allergen Reactions  . Ibuprofen Other (See Comments)    Due to other medications she takes for Lupus    Past Medical History, Surgical history, Social history, and Family History were reviewed and updated.  Review of Systems: All other 10 point review of systems is negative.   Physical Exam:  height is  (1.702 m) and weight is 159 lb (72.122 kg). Her oral temperature is 98.2 F (36.8 C). Her blood pressure is 98/53 and her pulse is 80. Her respiration is 16.   Wt Readings from Last 3 Encounters:  12/05/14 159 lb (72.122 kg)  06/06/14 160 lb (72.576 kg)  12/11/13 157 lb (71.215 kg)    Well-developed and well-nourished African-American female in no obvious distress. Head and neck exam shows no ocular or oral lesions. There are no palpable cervical or supraclavicular lymph nodes. Lungs are clear. Cardiac exam regular rate and rhythm with a normal S1 and S2. There are no murmurs, rubs or bruits. Abdomen is soft. She has  good bowel sounds. There is no fluid wave. There is no palpable liver or spleen tip. Back exam shows no tenderness over the spine, ribs or hips. Extremities shows no clubbing, cyanosis or edema. She has good range of motion of her joints. She has no tenderness over her Kinder bones. Skin exam shows no rashes, ecchymoses or petechia. Neurological exam shows no focal neurological deficits.: Lab Results  Component Value Date   WBC 9.5 12/05/2014   HGB 6.7* 12/05/2014   HCT 18.4* 12/05/2014   MCV 90 12/05/2014   PLT 379 12/05/2014   Lab Results  Component Value Date   FERRITIN 140 06/06/2014   IRON 90 06/06/2014   TIBC 360 06/06/2014   UIBC 270 06/06/2014   IRONPCTSAT 25 06/06/2014   Lab Results  Component Value Date   RETICCTPCT >23.0* 10/15/2014   RBC 2.04* 12/05/2014   RETICCTABS 429.7* 06/06/2014   No results found for: KPAFRELGTCHN, LAMBDASER,  KAPLAMBRATIO No results found for: IGGSERUM, IGA, IGMSERUM No results found for: Marda Stalker, SPEI   Chemistry      Component Value Date/Time   NA 137 10/15/2014 0434   K 3.6 10/15/2014 0434   CL 108 10/15/2014 0434   CO2 23 10/15/2014 0434   BUN 8 10/15/2014 0434   CREATININE 0.47 10/15/2014 0434      Component Value Date/Time   CALCIUM 8.8* 10/15/2014 0434   ALKPHOS 62 10/15/2014 0434   AST 59* 10/15/2014 0434   ALT 18 10/15/2014 0434   BILITOT 3.0* 10/15/2014 0434     Impression and Plan: [name] is a very pleasant 24 year old African American female with hemoglobin SS disease. She also has lupus. So far, everything was pretty good. Her labs looked okay. Her hemoglobin is a was low but currently, it probably is a little better than her baseline.  I think that as [place] she takes her folic acid, she will do okay.  I will not make an appointment for her. She is very busy with law school. She was can back to see [place] if she has any flareups or crises.    [name], MD 9/16/201610:12 AM

## 2014-12-05 NOTE — Telephone Encounter (Signed)
Dr Myna Hidalgo notified of pt's panic low hgb - 6.7. No new orders given at this time. dph

## 2014-12-09 LAB — HEMOGLOBINOPATHY EVALUATION
HGB A2 QUANT: 3.5 % — AB (ref 2.2–3.2)
Hemoglobin Other: 0 %
Hgb A: 0 % — ABNORMAL LOW (ref 96.8–97.8)
Hgb F Quant: 7.6 % — ABNORMAL HIGH (ref 0.0–2.0)
Hgb S Quant: 88.9 % — ABNORMAL HIGH

## 2014-12-09 LAB — RETICULOCYTES (CHCC)
ABS RETIC: 315.2 10*3/uL — AB (ref 19.0–186.0)
RBC.: 2.06 MIL/uL — AB (ref 3.87–5.11)
Retic Ct Pct: 15.3 % — ABNORMAL HIGH (ref 0.4–2.3)

## 2014-12-17 ENCOUNTER — Other Ambulatory Visit: Payer: Medicaid Other | Admitting: Lab

## 2014-12-17 ENCOUNTER — Ambulatory Visit: Payer: Medicaid Other | Admitting: Hematology & Oncology

## 2015-02-20 ENCOUNTER — Other Ambulatory Visit: Payer: Self-pay | Admitting: *Deleted

## 2015-02-20 ENCOUNTER — Other Ambulatory Visit: Payer: Self-pay | Admitting: Hematology & Oncology

## 2015-10-19 ENCOUNTER — Other Ambulatory Visit: Payer: Self-pay | Admitting: Hematology & Oncology

## 2015-10-19 ENCOUNTER — Other Ambulatory Visit: Payer: Self-pay | Admitting: Nurse Practitioner

## 2015-10-19 MED ORDER — VALACYCLOVIR HCL 500 MG PO TABS: ORAL_TABLET | ORAL | 3 refills | Status: AC

## 2016-04-28 ENCOUNTER — Other Ambulatory Visit: Payer: Self-pay

## 2016-04-28 DIAGNOSIS — M329 Systemic lupus erythematosus, unspecified: Secondary | ICD-10-CM

## 2016-04-28 DIAGNOSIS — D500 Iron deficiency anemia secondary to blood loss (chronic): Secondary | ICD-10-CM

## 2016-04-28 DIAGNOSIS — D571 Sickle-cell disease without crisis: Secondary | ICD-10-CM

## 2016-04-28 MED ORDER — IBUPROFEN 800 MG PO TABS: 800.0000 mg | ORAL_TABLET | Freq: Three times a day (TID) | ORAL | 3 refills | Status: DC | PRN

## 2016-04-29 ENCOUNTER — Other Ambulatory Visit: Payer: Self-pay | Admitting: *Deleted

## 2016-04-29 DIAGNOSIS — D571 Sickle-cell disease without crisis: Secondary | ICD-10-CM

## 2016-04-29 DIAGNOSIS — M329 Systemic lupus erythematosus, unspecified: Secondary | ICD-10-CM

## 2016-04-29 DIAGNOSIS — D500 Iron deficiency anemia secondary to blood loss (chronic): Secondary | ICD-10-CM

## 2016-04-29 MED ORDER — IBUPROFEN 800 MG PO TABS: 800.0000 mg | ORAL_TABLET | Freq: Three times a day (TID) | ORAL | 3 refills | Status: AC | PRN

## 2016-07-14 ENCOUNTER — Other Ambulatory Visit: Payer: Self-pay | Admitting: *Deleted

## 2016-07-14 DIAGNOSIS — D638 Anemia in other chronic diseases classified elsewhere: Secondary | ICD-10-CM

## 2016-07-14 DIAGNOSIS — D571 Sickle-cell disease without crisis: Secondary | ICD-10-CM

## 2016-07-15 ENCOUNTER — Ambulatory Visit: Payer: Medicaid Other | Admitting: Family

## 2016-07-15 ENCOUNTER — Other Ambulatory Visit: Payer: Medicaid Other

## 2016-07-18 ENCOUNTER — Ambulatory Visit (HOSPITAL_BASED_OUTPATIENT_CLINIC_OR_DEPARTMENT_OTHER): Payer: Medicaid Other | Admitting: Family

## 2016-07-18 ENCOUNTER — Ambulatory Visit (HOSPITAL_BASED_OUTPATIENT_CLINIC_OR_DEPARTMENT_OTHER): Payer: Medicaid Other

## 2016-07-18 ENCOUNTER — Other Ambulatory Visit: Payer: Self-pay | Admitting: *Deleted

## 2016-07-18 VITALS — BP 109/42 | HR 81 | Temp 98.5°F | Resp 16 | Wt 162.1 lb

## 2016-07-18 DIAGNOSIS — D571 Sickle-cell disease without crisis: Secondary | ICD-10-CM

## 2016-07-18 DIAGNOSIS — D5700 Hb-SS disease with crisis, unspecified: Secondary | ICD-10-CM | POA: Diagnosis not present

## 2016-07-18 DIAGNOSIS — R51 Headache: Secondary | ICD-10-CM

## 2016-07-18 DIAGNOSIS — D638 Anemia in other chronic diseases classified elsewhere: Secondary | ICD-10-CM

## 2016-07-18 LAB — COMPREHENSIVE METABOLIC PANEL (CC13)
ALBUMIN: 4.1 g/dL (ref 3.5–5.5)
ALK PHOS: 74 IU/L (ref 39–117)
ALT: 19 IU/L (ref 0–32)
AST (SGOT): 49 IU/L — ABNORMAL HIGH (ref 0–40)
Albumin/Globulin Ratio: 1.1 — ABNORMAL LOW (ref 1.2–2.2)
BUN / CREAT RATIO: 15 (ref 9–23)
BUN: 8 mg/dL (ref 6–20)
Bilirubin Total: 3.5 mg/dL — ABNORMAL HIGH (ref 0.0–1.2)
CALCIUM: 9.1 mg/dL (ref 8.7–10.2)
CO2: 23 mmol/L (ref 18–29)
Chloride, Ser: 105 mmol/L (ref 96–106)
Creatinine, Ser: 0.53 mg/dL — ABNORMAL LOW (ref 0.57–1.00)
GFR calc Af Amer: 152 mL/min/{1.73_m2} (ref >59)
GFR, EST NON AFRICAN AMERICAN: 131 mL/min/{1.73_m2} (ref >59)
GLOBULIN, TOTAL: 3.6 g/dL (ref 1.5–4.5)
Glucose: 117 mg/dL — ABNORMAL HIGH (ref 65–99)
Potassium, Ser: 4 mmol/L (ref 3.5–5.2)
Sodium: 136 mmol/L (ref 134–144)
TOTAL PROTEIN: 7.7 g/dL (ref 6.0–8.5)

## 2016-07-18 LAB — CBC WITH DIFFERENTIAL (CANCER CENTER ONLY)
BASO#: 0 10*3/uL (ref 0.0–0.2)
BASO%: 0.2 % (ref 0.0–2.0)
EOS%: 2 % (ref 0.0–7.0)
Eosinophils Absolute: 0.3 10*3/uL (ref 0.0–0.5)
HCT: 16.9 % — ABNORMAL LOW (ref 34.8–46.6)
HGB: 6.1 g/dL — CL (ref 11.6–15.9)
LYMPH#: 5.8 10*3/uL — ABNORMAL HIGH (ref 0.9–3.3)
LYMPH%: 44.6 % (ref 14.0–48.0)
MCH: 31.3 pg (ref 26.0–34.0)
MCHC: 36.1 g/dL — ABNORMAL HIGH (ref 32.0–36.0)
MCV: 87 fL (ref 81–101)
MONO#: 1 10*3/uL — ABNORMAL HIGH (ref 0.1–0.9)
MONO%: 7.7 % (ref 0.0–13.0)
NEUT#: 5.9 10*3/uL (ref 1.5–6.5)
NEUT%: 45.5 % (ref 39.6–80.0)
Platelets: 404 10*3/uL — ABNORMAL HIGH (ref 145–400)
RBC: 1.95 10*6/uL — ABNORMAL LOW (ref 3.70–5.32)
RDW: 23.5 % — AB (ref 11.1–15.7)
WBC: 12.9 10*3/uL — ABNORMAL HIGH (ref 3.9–10.0)

## 2016-07-18 LAB — CHCC SATELLITE - SMEAR

## 2016-07-18 LAB — TECHNOLOGIST REVIEW CHCC SATELLITE

## 2016-07-18 MED ORDER — HYDROMORPHONE HCL 4 MG PO TABS: 4.0000 mg | ORAL_TABLET | Freq: Four times a day (QID) | ORAL | 0 refills | Status: DC | PRN

## 2016-07-18 MED ORDER — L-GLUTAMINE ORAL POWDER: 10.0000 g | PACK | Freq: Two times a day (BID) | ORAL | 5 refills | Status: DC

## 2016-07-18 NOTE — Progress Notes (Signed)
Hematology and Oncology Follow Up Visit  Analy Ellis [id number] May 10, 1990 26 y.o. 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 She states that she feels like she is beginning to have a pain crisis. She has had a persistent headache, occasional dizziness and musculoskeletal pain/spasms in her chest. This has been for several days now.  She states that her last pain crisis was in February and she was able to manage at home with her pain medication and fluids.  She verbalized that she is taking her folic acid daily.  She is staying well hydrated daily and has maintained a good appetite. Her weight is stable.  She has an IUD in place and her cycle now is only on every other month with occasional spotting in between.  No swelling or tenderness in her extremities at this time. She has occasional numbness and tingling in her arms that she feels is positional.   ECOG Performance Status: 1 - Symptomatic but completely ambulatory  Medications:  Allergies as of 07/18/2016      Reactions   Ibuprofen Other (See Comments)   Due to other medications she takes for Lupus      Medication List       Accurate as of 07/18/16  2:41 PM. Always use your most recent med list.          DENTA 5000 PLUS 1.1 % Crea dental cream Generic drug:  sodium fluoride USE TOOTHPASTE TWICE DAILY   folic acid 1 MG tablet Commonly known as:  FOLVITE Take 1 mg by mouth daily.   HYDROcodone-acetaminophen 5-325 MG tablet Commonly known as:  NORCO/VICODIN TAKE ONE TO TWO TABLETS BY MOUTH EVERY FOUR TO SIX HOURS AS NEEDED FOR PAIN   HYDROmorphone 4 MG tablet Commonly known as:  DILAUDID Take 4 mg by mouth every 4 (four) hours as needed for severe pain.   ibuprofen 800 MG tablet Commonly  known as:  ADVIL,MOTRIN Take 1 tablet (800 mg total) by mouth every 8 (eight) hours as needed for moderate pain.   SUMAtriptan 50 MG tablet Commonly known as:  IMITREX Take 1 tablet (50 mg total) by mouth every 2 (two) hours as needed for migraine or headache. May repeat in 2 hours if headache persists or recurs.   valACYclovir 500 MG tablet Commonly known as:  VALTREX TAKE 1 TABLET BY MOUTH TWICE A DAY AS NEEDED FOR OUTBREAK       Allergies:  Allergies  Allergen Reactions  . Ibuprofen Other (See Comments)    Due to other medications she takes for Lupus    Past Medical History, Surgical history, Social history, and Family History were reviewed and updated.  Review of Systems: All other 10 point review of systems is negative.   Physical Exam:  weight is 162 lb 1.9 oz (73.5 kg). Her oral temperature is 98.5 F (36.9 C). Her blood pressure is 109/42 (abnormal) and her pulse is 81. Her respiration is 16 and oxygen saturation is 97%.   Wt Readings from Last 3 Encounters:  07/18/16 162 lb 1.9 oz (73.5 kg)  12/05/14 159 lb (72.1 kg)  06/06/14 160 lb (72.6 kg)    Ocular: Sclerae unicteric, pupils equal, round and reactive to light Ear-nose-throat: Oropharynx clear,  dentition fair Lymphatic: No cervical, supraclavicular or axillary adenopathy Lungs no rales or rhonchi, good excursion bilaterally Heart regular rate and rhythm, no murmur appreciated Abd soft, nontender, positive bowel sounds, no liver or spleen tip palpated on exam, no fluid wave MSK no focal spinal tenderness, no joint edema Neuro: non-focal, well-oriented, appropriate affect Breasts: Deferred  Lab Results  Component Value Date   WBC 9.5 12/05/2014   HGB 6.7 (LL) 12/05/2014   HCT 18.4 (L) 12/05/2014   MCV 90 12/05/2014   PLT 379 12/05/2014   Lab Results  Component Value Date   FERRITIN 118 12/05/2014   IRON 89 12/05/2014   TIBC 348 12/05/2014   UIBC 258 12/05/2014   IRONPCTSAT 26 12/05/2014   Lab  Results  Component Value Date   RETICCTPCT 15.3 (H) 12/05/2014   RBC 2.06 (L) 12/05/2014   RETICCTABS 315.2 (H) 12/05/2014   No results found for: KPAFRELGTCHN, LAMBDASER, KAPLAMBRATIO No results found for: IGGSERUM, IGA, IGMSERUM No results found for: Dorene Ar, A1GS, A2GS, Karn Pickler, SPEI   Chemistry      Component Value Date/Time   NA 136 12/05/2014 0916   K 3.9 12/05/2014 0916   CL 109 (H) 12/05/2014 0916   CO2 24 12/05/2014 0916   BUN 6 (L) 12/05/2014 0916   CREATININE 0.5 (L) 12/05/2014 0916      Component Value Date/Time   CALCIUM 8.6 12/05/2014 0916   ALKPHOS 79 12/05/2014 0916   AST 62 (H) 12/05/2014 0916   ALT 31 12/05/2014 0916   BILITOT 3.60 (H) 12/05/2014 0916      Impression and Plan: [name] is a very pleasant 26 yo African American female with hemoglobin SS disease and also lupus. She is here today with c/o musculoskeletal chest pain/spasms, dizziness and persistent headache. She feels that she is starting a pain crisis and would like to get it under control without going to the ED. 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  Hgb is 6.1 at this time with an MCV of 87. She denies needing fluids and does not feel that she needs transfused at this time.  I have refilled her Dilaudid prescription. She is making sure to stay well hydrated.  Her Hemoglobinopathy eval is pending.  We will also have her go ahead and start Endari twice daily to help with the prevention of crisis.  We will plan to see her back in 2 months for follow-up and labs to make sure she is feeling well prior to her exam in July.  She will contact our office with any questions or concerns. We can certainly see her sooner if need be.   Verdie Mosher, NP 4/30/20182:41 PM

## 2016-07-19 LAB — IRON AND TIBC
%SAT: 21 % (ref 21–57)
Iron: 74 ug/dL (ref 41–142)
TIBC: 360 ug/dL (ref 236–444)
UIBC: 286 ug/dL (ref 120–384)

## 2016-07-19 LAB — FERRITIN: Ferritin: 63 ng/ml (ref 9–269)

## 2016-07-19 LAB — RETICULOCYTES: Reticulocyte Count: 15.5 % — ABNORMAL HIGH (ref 0.6–2.6)

## 2016-07-21 LAB — HEMOGLOBINOPATHY EVALUATION
HGB A: 0 % — AB (ref 96.4–98.8)
HGB C: 0 %
HGB S: 89.9 % — ABNORMAL HIGH
HGB VARIANT: 0 %
Hemoglobin A2 Quantitation: 4.7 % — ABNORMAL HIGH (ref 1.8–3.2)
Hemoglobin F Quantitation: 5.4 % — ABNORMAL HIGH (ref 0.0–2.0)

## 2016-07-25 ENCOUNTER — Other Ambulatory Visit: Payer: Self-pay | Admitting: *Deleted

## 2016-07-25 DIAGNOSIS — D5700 Hb-SS disease with crisis, unspecified: Secondary | ICD-10-CM

## 2016-07-25 MED ORDER — L-GLUTAMINE ORAL POWDER: 10.0000 g | PACK | Freq: Two times a day (BID) | ORAL | 5 refills | Status: DC

## 2016-09-16 ENCOUNTER — Ambulatory Visit: Payer: Medicaid Other | Admitting: Family

## 2016-09-16 ENCOUNTER — Other Ambulatory Visit: Payer: Medicaid Other

## 2019-02-11 ENCOUNTER — Telehealth: Payer: Self-pay | Admitting: Hematology & Oncology

## 2019-02-11 NOTE — Telephone Encounter (Signed)
Faxed medical records to: [facility]: [phone]    Req: 01/30/2016 - Present      COPY SCANNED

## 2020-08-21 ENCOUNTER — Telehealth: Payer: Self-pay | Admitting: *Deleted

## 2020-08-21 NOTE — Telephone Encounter (Signed)
Per Asher Muir - navigator - called patient and gave upcoming appointment - re-establish patient care

## 2020-09-08 ENCOUNTER — Telehealth: Payer: Self-pay

## 2020-09-08 ENCOUNTER — Encounter: Payer: Self-pay | Admitting: Hematology & Oncology

## 2020-09-08 ENCOUNTER — Inpatient Hospital Stay: Payer: 59 | Attending: Hematology & Oncology

## 2020-09-08 ENCOUNTER — Inpatient Hospital Stay (HOSPITAL_BASED_OUTPATIENT_CLINIC_OR_DEPARTMENT_OTHER): Payer: 59 | Admitting: Hematology & Oncology

## 2020-09-08 ENCOUNTER — Other Ambulatory Visit: Payer: Self-pay

## 2020-09-08 VITALS — BP 112/58 | HR 80 | Temp 98.4°F | Resp 18 | Ht 68.0 in | Wt 158.8 lb

## 2020-09-08 DIAGNOSIS — D571 Sickle-cell disease without crisis: Secondary | ICD-10-CM | POA: Diagnosis not present

## 2020-09-08 DIAGNOSIS — Z86711 Personal history of pulmonary embolism: Secondary | ICD-10-CM | POA: Diagnosis present

## 2020-09-08 DIAGNOSIS — Z7901 Long term (current) use of anticoagulants: Secondary | ICD-10-CM | POA: Insufficient documentation

## 2020-09-08 DIAGNOSIS — D5700 Hb-SS disease with crisis, unspecified: Secondary | ICD-10-CM | POA: Insufficient documentation

## 2020-09-08 LAB — CMP (CANCER CENTER ONLY)
ALT: 29 U/L (ref 0–44)
AST: 40 U/L (ref 15–41)
Albumin: 4.3 g/dL (ref 3.5–5.0)
Alkaline Phosphatase: 72 U/L (ref 38–126)
Anion gap: 8 (ref 5–15)
BUN: 11 mg/dL (ref 6–20)
CO2: 25 mmol/L (ref 22–32)
Calcium: 10 mg/dL (ref 8.9–10.3)
Chloride: 104 mmol/L (ref 98–111)
Creatinine: 0.55 mg/dL (ref 0.44–1.00)
GFR, Estimated: >60 mL/min (ref >60)
Glucose, Bld: 98 mg/dL (ref 70–99)
Potassium: 3.8 mmol/L (ref 3.5–5.1)
Sodium: 137 mmol/L (ref 135–145)
Total Bilirubin: 2.3 mg/dL — ABNORMAL HIGH (ref 0.3–1.2)
Total Protein: 7.6 g/dL (ref 6.5–8.1)

## 2020-09-08 LAB — CBC WITH DIFFERENTIAL (CANCER CENTER ONLY)
Abs Immature Granulocytes: 0.08 10*3/uL — ABNORMAL HIGH (ref 0.00–0.07)
Basophils Absolute: 0.1 10*3/uL (ref 0.0–0.1)
Basophils Relative: 1 %
Eosinophils Absolute: 0.2 10*3/uL (ref 0.0–0.5)
Eosinophils Relative: 2 %
HCT: 22.7 % — ABNORMAL LOW (ref 36.0–46.0)
Hemoglobin: 7.4 g/dL — ABNORMAL LOW (ref 12.0–15.0)
Immature Granulocytes: 1 %
Lymphocytes Relative: 30 %
Lymphs Abs: 3.2 10*3/uL (ref 0.7–4.0)
MCH: 31.1 pg (ref 26.0–34.0)
MCHC: 32.6 g/dL (ref 30.0–36.0)
MCV: 95.4 fL (ref 80.0–100.0)
Monocytes Absolute: 1.4 10*3/uL — ABNORMAL HIGH (ref 0.1–1.0)
Monocytes Relative: 13 %
Neutro Abs: 5.7 10*3/uL (ref 1.7–7.7)
Neutrophils Relative %: 53 %
Platelet Count: 311 10*3/uL (ref 150–400)
RBC: 2.38 MIL/uL — ABNORMAL LOW (ref 3.87–5.11)
RDW: 19.1 % — ABNORMAL HIGH (ref 11.5–15.5)
WBC Count: 10.7 10*3/uL — ABNORMAL HIGH (ref 4.0–10.5)
nRBC: 0.3 % — ABNORMAL HIGH (ref 0.0–0.2)

## 2020-09-08 LAB — RETICULOCYTES
Immature Retic Fract: 15.5 % (ref 2.3–15.9)
RBC.: 2.45 MIL/uL — ABNORMAL LOW (ref 3.87–5.11)
Retic Count, Absolute: 208.5 10*3/uL — ABNORMAL HIGH (ref 19.0–186.0)
Retic Ct Pct: 8.5 % — ABNORMAL HIGH (ref 0.4–3.1)

## 2020-09-08 LAB — LACTATE DEHYDROGENASE: LDH: 408 U/L — ABNORMAL HIGH (ref 98–192)

## 2020-09-08 NOTE — Telephone Encounter (Signed)
Appts made per 09/08/20 los and pt req to view in [employer]

## 2020-09-09 LAB — IRON AND TIBC
Iron: 301 ug/dL — ABNORMAL HIGH (ref 41–142)
Saturation Ratios: 97 % — ABNORMAL HIGH (ref 21–57)
TIBC: 310 ug/dL (ref 236–444)
UIBC: 9 ug/dL — ABNORMAL LOW (ref 120–384)

## 2020-09-09 LAB — FERRITIN: Ferritin: 281 ng/mL (ref 11–307)

## 2020-09-11 LAB — HGB FRACTIONATION CASCADE: Hgb A2: 2.8 % (ref 1.8–3.2)

## 2020-09-11 LAB — HGB FRAC BY HPLC+SOLUBILITY
Hgb A: 33.1 % — ABNORMAL LOW (ref 96.4–98.8)
Hgb C: 0 %
Hgb E: 0 %
Hgb F: 4.4 % — ABNORMAL HIGH (ref 0.0–2.0)
Hgb S: 59.7 % — ABNORMAL HIGH
Hgb Solubility: POSITIVE — AB
Hgb Variant: 0 %

## 2020-09-14 NOTE — Progress Notes (Signed)
Hematology and Oncology Follow Up Visit  [name] [id number] 1990-10-20 30 y.o. 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 daily     Interim History:  [name] is back for a Strohecker awaited visit.  We last saw her about 4 years ago.  Should then quite busy.  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  She apparently had a sickle cell crisis.  This was down in [place].  She had a transfusion.  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  She had headaches.  She apparently was diagnosed with PRES on MRI.  She was having headaches.  She was given Tylenol.  Everything seemed to improve and resolve.  She had the pulmonary embolism.  This was on 08/22/2020.  She was on Xarelto.  Last CBC that I see from her on 08/30/2020 showed a white cell count of 12.9.  Hemoglobin 8.3.  Platelet count was 378,000.  She is showing seems to be doing pretty well right now.  Again she is quite busy with work.  She is having no issues with fever.  She had no COVID problems.  She has had no problems with nausea or vomiting.  Has been no change in bowel or bladder habits.  She has had no leg swelling.  She has had no rashes.  Currently, I would have to say her performance status is ECOG 1.  Medications:  Current Outpatient Medications:    apixaban (ELIQUIS) 5 MG TABS tablet, Take 5 mg by mouth 2 (two) times daily., Disp: , Rfl:    cholecalciferol (VITAMIN D3) 25 MCG (1000 UNIT) tablet, Take 1,000 Units by mouth daily., Disp: , Rfl:    folic acid (FOLVITE) 1 MG tablet, Take 1 mg by mouth daily., Disp: , Rfl:    DENTA 5000 PLUS 1.1 % CREA dental cream, USE TOOTHPASTE TWICE DAILY, Disp: , Rfl: 1   HYDROmorphone (DILAUDID) 4 MG tablet, Take 1 tablet (4 mg total) by mouth every 6 (six) hours as needed for severe pain. (Patient not taking: Reported on 09/08/2020), Disp: 60  tablet, Rfl: 0   ibuprofen (ADVIL,MOTRIN) 800 MG tablet, Take 1 tablet (800 mg total) by mouth every 8 (eight) hours as needed for moderate pain., Disp: 90 tablet, Rfl: 3   L-glutamine (ENDARI) 5 g PACK Powder Packet, Take 10 g by mouth 2 (two) times daily., Disp: 60 packet, Rfl: 5   SUMAtriptan (IMITREX) 50 MG tablet, Take 1 tablet (50 mg total) by mouth every 2 (two) hours as needed for migraine or headache. May repeat in 2 hours if headache persists or recurs., Disp: 10 tablet, Rfl: 3   valACYclovir (VALTREX) 500 MG tablet, TAKE 1 TABLET BY MOUTH TWICE A DAY AS NEEDED FOR OUTBREAK (Patient not taking: Reported on 09/08/2020), Disp: 60 tablet, Rfl: 3  Allergies:  Allergies  Allergen Reactions   Ibuprofen Other (See Comments)    Due to other medications she takes for Lupus    Past Medical History, Surgical history, Social history, and Family History were reviewed and updated.  Review of Systems: Review of Systems  Constitutional: Negative.   HENT:  Negative.    Eyes: Negative.   Respiratory: Negative.    Cardiovascular: Negative.   Gastrointestinal: Negative.   Endocrine: Negative.   Genitourinary: Negative.    Musculoskeletal: Negative.   Skin: Negative.  Neurological: Negative.   Hematological: Negative.   Psychiatric/Behavioral: Negative.     Physical Exam:  height is 5\' 8"  (1.727 m) and weight is 158 lb 12 oz (72 kg). Her oral temperature is 98.4 F (36.9 C). Her blood pressure is 112/58 (abnormal) and her pulse is 80. Her respiration is 18 and oxygen saturation is 100%.   Wt Readings from Last 3 Encounters:  09/08/20 158 lb 12 oz (72 kg)  07/18/16 162 lb 1.9 oz (73.5 kg)  12/05/14 159 lb (72.1 kg)    Physical Exam Vitals reviewed.  HENT:     Head: Normocephalic and atraumatic.  Eyes:     Pupils: Pupils are equal, round, and reactive to light.  Cardiovascular:     Rate and Rhythm: Normal rate and regular rhythm.     Heart sounds: Normal heart sounds.  Pulmonary:      Effort: Pulmonary effort is normal.     Breath sounds: Normal breath sounds.  Abdominal:     General: Bowel sounds are normal.     Palpations: Abdomen is soft.  Musculoskeletal:        General: No tenderness or deformity. Normal range of motion.     Cervical back: Normal range of motion.  Lymphadenopathy:     Cervical: No cervical adenopathy.  Skin:    General: Skin is warm and dry.     Findings: No erythema or rash.  Neurological:     Mental Status: She is alert and oriented to person, place, and time.  Psychiatric:        Behavior: Behavior normal.        Thought Content: Thought content normal.        Judgment: Judgment normal.     Lab Results  Component Value Date   WBC 10.7 (H) 09/08/2020   HGB 7.4 (L) 09/08/2020   HCT 22.7 (L) 09/08/2020   MCV 95.4 09/08/2020   PLT 311 09/08/2020     Chemistry      Component Value Date/Time   NA 137 09/08/2020 1026   NA 136 07/18/2016 1411   NA 136 12/05/2014 0916   K 3.8 09/08/2020 1026   K 4.0 07/18/2016 1411   K 3.9 12/05/2014 0916   CL 104 09/08/2020 1026   CL 105 07/18/2016 1411   CL 109 (H) 12/05/2014 0916   CO2 25 09/08/2020 1026   CO2 23 07/18/2016 1411   CO2 24 12/05/2014 0916   BUN 11 09/08/2020 1026   BUN 8 07/18/2016 1411   BUN 6 (L) 12/05/2014 0916   CREATININE 0.55 09/08/2020 1026   CREATININE 0.53 (L) 07/18/2016 1411   CREATININE 0.5 (L) 12/05/2014 0916      Component Value Date/Time   CALCIUM 10.0 09/08/2020 1026   CALCIUM 9.1 07/18/2016 1411   CALCIUM 8.6 12/05/2014 0916   ALKPHOS 72 09/08/2020 1026   ALKPHOS 74 07/18/2016 1411   ALKPHOS 79 12/05/2014 0916   AST 40 09/08/2020 1026   ALT 29 09/08/2020 1026   ALT 31 12/05/2014 0916   BILITOT 2.3 (H) 09/08/2020 1026     Impression and Plan: [name] is a very nice 30 year old African-American female.  She has Hemoglobin SS disease.  She really has been doing well.  And sounds like she had a problem recently.  This was down in [place].  I  forgot to mention that iron studies that we did today showed a ferritin of 281 with iron saturation of 97%.  Have to really watch out  with this.  We have to make sure that iron overload does not become a problem.  Again I have not heard of a transfusion causing PRES.  She seems to have recovered from this.  Her vital signs all look quite good.  Her blood pressure is fine.  Again she has always had a low hemoglobin.  She is on folic acid.  We will definitely follow her along.  She was knows that she can call us if there are any problems.  She lives down in [place].  She is very busy down there.  It was nice to see her again.     [name], MD 6/27/20223:43 PM

## 2020-11-11 ENCOUNTER — Other Ambulatory Visit: Payer: Self-pay | Admitting: *Deleted

## 2020-11-11 ENCOUNTER — Telehealth: Payer: Self-pay | Admitting: *Deleted

## 2020-11-11 MED ORDER — FOLIC ACID 1 MG PO TABS: 1.0000 mg | ORAL_TABLET | Freq: Every day | ORAL | 2 refills | Status: DC

## 2020-11-11 MED ORDER — RIVAROXABAN 20 MG PO TABS: 20.0000 mg | ORAL_TABLET | Freq: Every day | ORAL | 2 refills | Status: DC

## 2020-11-11 MED ORDER — HYDROMORPHONE HCL 4 MG PO TABS: 4.0000 mg | ORAL_TABLET | Freq: Four times a day (QID) | ORAL | 0 refills | Status: AC | PRN

## 2020-11-11 NOTE — Telephone Encounter (Signed)
Message received from patient requesting a refill for Folic Acid, Eliquis and Dilaudid.  Call placed back to patient to notify her that Dr. Myna Hidalgo will refill the Dilaudid and Folic Acid and would like for pt to switch from Eliquis 5 mg BID to Xarelto 20 mg daily.  Pt requests that all prescriptions be sent to [facility] on [address] in [place].  Prescriptions sent per pt.'s request.

## 2020-11-27 ENCOUNTER — Ambulatory Visit: Payer: 59 | Admitting: Hematology & Oncology

## 2020-11-27 ENCOUNTER — Other Ambulatory Visit: Payer: 59

## 2020-12-07 ENCOUNTER — Telehealth: Payer: Self-pay

## 2020-12-07 ENCOUNTER — Ambulatory Visit (HOSPITAL_BASED_OUTPATIENT_CLINIC_OR_DEPARTMENT_OTHER)
Admission: RE | Admit: 2020-12-07 | Discharge: 2020-12-07 | Disposition: A | Discharge status: Home / Self Care | Payer: 59 | Source: Ambulatory Visit | Attending: Hematology & Oncology | Admitting: Hematology & Oncology

## 2020-12-07 ENCOUNTER — Encounter: Payer: Self-pay | Admitting: Hematology & Oncology

## 2020-12-07 ENCOUNTER — Inpatient Hospital Stay (HOSPITAL_BASED_OUTPATIENT_CLINIC_OR_DEPARTMENT_OTHER): Payer: 59 | Admitting: Hematology & Oncology

## 2020-12-07 ENCOUNTER — Other Ambulatory Visit: Payer: Self-pay

## 2020-12-07 ENCOUNTER — Inpatient Hospital Stay: Payer: 59 | Attending: Hematology & Oncology

## 2020-12-07 VITALS — BP 110/68 | HR 80 | Temp 98.0°F | Resp 20 | Wt 167.8 lb

## 2020-12-07 DIAGNOSIS — D571 Sickle-cell disease without crisis: Secondary | ICD-10-CM

## 2020-12-07 LAB — CMP (CANCER CENTER ONLY)
ALT: 16 U/L (ref 0–44)
AST: 46 U/L — ABNORMAL HIGH (ref 15–41)
Albumin: 4.2 g/dL (ref 3.5–5.0)
Alkaline Phosphatase: 65 U/L (ref 38–126)
Anion gap: 8 (ref 5–15)
BUN: 6 mg/dL (ref 6–20)
CO2: 25 mmol/L (ref 22–32)
Calcium: 9.2 mg/dL (ref 8.9–10.3)
Chloride: 108 mmol/L (ref 98–111)
Creatinine: 0.52 mg/dL (ref 0.44–1.00)
GFR, Estimated: >60 mL/min (ref >60)
Glucose, Bld: 96 mg/dL (ref 70–99)
Potassium: 4 mmol/L (ref 3.5–5.1)
Sodium: 141 mmol/L (ref 135–145)
Total Bilirubin: 5 mg/dL (ref 0.3–1.2)
Total Protein: 7.5 g/dL (ref 6.5–8.1)

## 2020-12-07 LAB — CBC WITH DIFFERENTIAL (CANCER CENTER ONLY)
Abs Immature Granulocytes: 0.11 10*3/uL — ABNORMAL HIGH (ref 0.00–0.07)
Basophils Absolute: 0 10*3/uL (ref 0.0–0.1)
Basophils Relative: 0 %
Eosinophils Absolute: 0.4 10*3/uL (ref 0.0–0.5)
Eosinophils Relative: 3 %
HCT: 18.5 % — ABNORMAL LOW (ref 36.0–46.0)
Hemoglobin: 6.6 g/dL — CL (ref 12.0–15.0)
Immature Granulocytes: 1 %
Lymphocytes Relative: 35 %
Lymphs Abs: 4.9 10*3/uL — ABNORMAL HIGH (ref 0.7–4.0)
MCH: 34.7 pg — ABNORMAL HIGH (ref 26.0–34.0)
MCHC: 35.7 g/dL (ref 30.0–36.0)
MCV: 97.4 fL (ref 80.0–100.0)
Monocytes Absolute: 1.1 10*3/uL — ABNORMAL HIGH (ref 0.1–1.0)
Monocytes Relative: 8 %
Neutro Abs: 7.7 10*3/uL (ref 1.7–7.7)
Neutrophils Relative %: 53 %
Platelet Count: 388 10*3/uL (ref 150–400)
RBC: 1.9 MIL/uL — ABNORMAL LOW (ref 3.87–5.11)
RDW: 27.2 % — ABNORMAL HIGH (ref 11.5–15.5)
WBC Count: 14.2 10*3/uL — ABNORMAL HIGH (ref 4.0–10.5)
nRBC: 1.6 % — ABNORMAL HIGH (ref 0.0–0.2)

## 2020-12-07 LAB — D-DIMER, QUANTITATIVE: D-Dimer, Quant: 0.45 ug/mL-FEU (ref 0.00–0.50)

## 2020-12-07 LAB — RETICULOCYTES
Immature Retic Fract: 35.9 % — ABNORMAL HIGH (ref 2.3–15.9)
RBC.: 1.92 MIL/uL — ABNORMAL LOW (ref 3.87–5.11)

## 2020-12-07 LAB — IRON AND TIBC
Iron: 124 ug/dL (ref 41–142)
Saturation Ratios: 41 % (ref 21–57)
TIBC: 304 ug/dL (ref 236–444)
UIBC: 180 ug/dL (ref 120–384)

## 2020-12-07 LAB — FERRITIN: Ferritin: 152 ng/mL (ref 11–307)

## 2020-12-07 MED ORDER — IOHEXOL 350 MG/ML SOLN
100.0000 mL | Freq: Once | INTRAVENOUS | Status: AC | PRN
  Administered 2020-12-07: 100 mL via INTRAVENOUS

## 2020-12-07 NOTE — Progress Notes (Signed)
Lab informed me that patient had a panic HGB of 6.6. Results given to MD.

## 2020-12-07 NOTE — Telephone Encounter (Signed)
Appts made per 12/07/20 los, pt placed in phone  [place]

## 2020-12-07 NOTE — Progress Notes (Signed)
Hematology and Oncology Follow Up Visit  [name] [id number] 06/05/1990 30 y.o. 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 daily     Interim History:  [name] is back for follow-up.  We last saw her back in June.  We did do a CT angiogram of her chest today.  There is no evidence of pulmonary emboli.  Told her that she needs to be on full dose of Xarelto for a year.  I would then have her on maintenance Xarelto at 10 mg for 1 year after that.  The big news is that she will be going to [place] over Christmas.  Her family is from [place].  She is fluent in [place].  The other big news is that she will be moving up to [place].  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  I am so happy for her.  I think she would be somebody perfect who could actually work at the [employer] if a job ever opened up.  Her hemoglobin is only 6.6.  She says this is normal for her.  She has had no problems with cough.  There is no chest wall pain.  Is no bony pain.  She still has her monthly cycles.  Overall, I would have to say her performance status right now is ECOG 1.     Medications:  Current Outpatient Medications:    cholecalciferol (VITAMIN D3) 25 MCG (1000 UNIT) tablet, Take 1,000 Units by mouth daily., Disp: , Rfl:    DENTA 5000 PLUS 1.1 % CREA dental cream, USE TOOTHPASTE TWICE DAILY, Disp: , Rfl: 1   folic acid (FOLVITE) 1 MG tablet, Take 1 tablet (1 mg total) by mouth daily., Disp: 30 tablet, Rfl: 2   HYDROmorphone (DILAUDID) 4 MG tablet, Take 1 tablet (4 mg total) by mouth every 6 (six) hours as needed for severe pain., Disp: 30 tablet, Rfl: 0   ibuprofen (ADVIL,MOTRIN) 800 MG tablet, Take 1 tablet (800 mg total) by mouth every 8 (eight) hours as needed for moderate pain., Disp: 90 tablet, Rfl: 3   L-glutamine (ENDARI) 5 g PACK Powder Packet, Take 10 g by mouth 2 (two) times  daily., Disp: 60 packet, Rfl: 5   rivaroxaban (XARELTO) 20 MG TABS tablet, Take 1 tablet (20 mg total) by mouth daily with supper., Disp: 30 tablet, Rfl: 2   SUMAtriptan (IMITREX) 50 MG tablet, Take 1 tablet (50 mg total) by mouth every 2 (two) hours as needed for migraine or headache. May repeat in 2 hours if headache persists or recurs., Disp: 10 tablet, Rfl: 3   valACYclovir (VALTREX) 500 MG tablet, TAKE 1 TABLET BY MOUTH TWICE A DAY AS NEEDED FOR OUTBREAK (Patient not taking: Reported on 09/08/2020), Disp: 60 tablet, Rfl: 3  Allergies:  Allergies  Allergen Reactions   Ibuprofen Other (See Comments)    Due to other medications she takes for Lupus    Past Medical History, Surgical history, Social history, and Family History were reviewed and updated.  Review of Systems: Review of Systems  Constitutional: Negative.   HENT:  Negative.    Eyes: Negative.   Respiratory: Negative.    Cardiovascular: Negative.   Gastrointestinal: Negative.   Endocrine: Negative.   Genitourinary: Negative.    Musculoskeletal: Negative.   Skin: Negative.   Neurological: Negative.   Hematological: Negative.   Psychiatric/Behavioral: Negative.  Physical Exam:  vitals were not taken for this visit.   Wt Readings from Last 3 Encounters:  09/08/20 158 lb 12 oz (72 kg)  07/18/16 162 lb 1.9 oz (73.5 kg)  12/05/14 159 lb (72.1 kg)    Physical Exam Vitals reviewed.  HENT:     Head: Normocephalic and atraumatic.  Eyes:     Pupils: Pupils are equal, round, and reactive to light.  Cardiovascular:     Rate and Rhythm: Normal rate and regular rhythm.     Heart sounds: Normal heart sounds.  Pulmonary:     Effort: Pulmonary effort is normal.     Breath sounds: Normal breath sounds.  Abdominal:     General: Bowel sounds are normal.     Palpations: Abdomen is soft.  Musculoskeletal:        General: No tenderness or deformity. Normal range of motion.     Cervical back: Normal range of motion.   Lymphadenopathy:     Cervical: No cervical adenopathy.  Skin:    General: Skin is warm and dry.     Findings: No erythema or rash.  Neurological:     Mental Status: She is alert and oriented to person, place, and time.  Psychiatric:        Behavior: Behavior normal.        Thought Content: Thought content normal.        Judgment: Judgment normal.     Lab Results  Component Value Date   WBC 14.2 (H) 12/07/2020   HGB 6.6 (LL) 12/07/2020   HCT 18.5 (L) 12/07/2020   MCV 97.4 12/07/2020   PLT 388 12/07/2020     Chemistry      Component Value Date/Time   NA 141 12/07/2020 1051   NA 136 07/18/2016 1411   NA 136 12/05/2014 0916   K 4.0 12/07/2020 1051   K 4.0 07/18/2016 1411   K 3.9 12/05/2014 0916   CL 108 12/07/2020 1051   CL 105 07/18/2016 1411   CL 109 (H) 12/05/2014 0916   CO2 25 12/07/2020 1051   CO2 23 07/18/2016 1411   CO2 24 12/05/2014 0916   BUN 6 12/07/2020 1051   BUN 8 07/18/2016 1411   BUN 6 (L) 12/05/2014 0916   CREATININE 0.52 12/07/2020 1051   CREATININE 0.53 (L) 07/18/2016 1411   CREATININE 0.5 (L) 12/05/2014 0916      Component Value Date/Time   CALCIUM 9.2 12/07/2020 1051   CALCIUM 9.1 07/18/2016 1411   CALCIUM 8.6 12/05/2014 0916   ALKPHOS 65 12/07/2020 1051   ALKPHOS 74 07/18/2016 1411   ALKPHOS 79 12/05/2014 0916   AST 46 (H) 12/07/2020 1051   ALT 16 12/07/2020 1051   ALT 31 12/05/2014 0916   BILITOT 5.0 (HH) 12/07/2020 1051     Impression and Plan: Ms. Bousquet is a very nice 30 year old African-American female.  She has Hemoglobin SS disease.  She is doing pretty well right now.  I am just happy that she has not had a problem with crisis.  I would really like to see her before she goes over to [place] in December.  I want to try to make sure that her blood is as good as possible.  Again, I told her that she can have to be on the Xarelto.  I will plan to have her come back to see me in early December.     Whitney Macho,  MD 9/19/202211:44 AM

## 2021-02-06 ENCOUNTER — Other Ambulatory Visit: Payer: Self-pay | Admitting: Hematology & Oncology

## 2021-02-26 ENCOUNTER — Ambulatory Visit: Payer: 59 | Admitting: Hematology & Oncology

## 2021-02-26 ENCOUNTER — Other Ambulatory Visit: Payer: 59

## 2021-03-12 ENCOUNTER — Telehealth: Payer: Self-pay | Admitting: Hematology & Oncology

## 2021-03-12 NOTE — Telephone Encounter (Signed)
Rescheduled appt per 03/12/21 sch msg - left message for patient with new appt date and time. ' Unable to reach patient.rescheduled appt due to provider PAL due to the afternoon

## 2021-03-19 ENCOUNTER — Other Ambulatory Visit: Payer: Self-pay

## 2021-03-19 ENCOUNTER — Inpatient Hospital Stay (HOSPITAL_BASED_OUTPATIENT_CLINIC_OR_DEPARTMENT_OTHER): Payer: BC Managed Care – PPO | Admitting: Hematology & Oncology

## 2021-03-19 ENCOUNTER — Telehealth: Payer: Self-pay | Admitting: Hematology & Oncology

## 2021-03-19 ENCOUNTER — Other Ambulatory Visit: Payer: 59

## 2021-03-19 ENCOUNTER — Encounter: Payer: Self-pay | Admitting: Hematology & Oncology

## 2021-03-19 ENCOUNTER — Inpatient Hospital Stay: Payer: BC Managed Care – PPO | Attending: Hematology & Oncology

## 2021-03-19 ENCOUNTER — Ambulatory Visit: Payer: 59 | Admitting: Hematology & Oncology

## 2021-03-19 VITALS — BP 101/44 | HR 69 | Temp 98.1°F | Resp 16 | Wt 166.0 lb

## 2021-03-19 DIAGNOSIS — D571 Sickle-cell disease without crisis: Secondary | ICD-10-CM | POA: Diagnosis present

## 2021-03-19 DIAGNOSIS — Z7901 Long term (current) use of anticoagulants: Secondary | ICD-10-CM | POA: Insufficient documentation

## 2021-03-19 DIAGNOSIS — I2699 Other pulmonary embolism without acute cor pulmonale: Secondary | ICD-10-CM | POA: Insufficient documentation

## 2021-03-19 DIAGNOSIS — D5701 Hb-SS disease with acute chest syndrome: Secondary | ICD-10-CM | POA: Diagnosis not present

## 2021-03-19 LAB — CMP (CANCER CENTER ONLY)
ALT: 11 U/L (ref 0–44)
AST: 30 U/L (ref 15–41)
Albumin: 4.1 g/dL (ref 3.5–5.0)
Alkaline Phosphatase: 55 U/L (ref 38–126)
Anion gap: 6 (ref 5–15)
BUN: 10 mg/dL (ref 6–20)
CO2: 24 mmol/L (ref 22–32)
Calcium: 9.2 mg/dL (ref 8.9–10.3)
Chloride: 109 mmol/L (ref 98–111)
Creatinine: 0.53 mg/dL (ref 0.44–1.00)
GFR, Estimated: >60 mL/min (ref >60)
Glucose, Bld: 109 mg/dL — ABNORMAL HIGH (ref 70–99)
Potassium: 4.2 mmol/L (ref 3.5–5.1)
Sodium: 139 mmol/L (ref 135–145)
Total Bilirubin: 3.1 mg/dL — ABNORMAL HIGH (ref 0.3–1.2)
Total Protein: 7.2 g/dL (ref 6.5–8.1)

## 2021-03-19 LAB — CBC WITH DIFFERENTIAL (CANCER CENTER ONLY)
Abs Immature Granulocytes: 0.07 10*3/uL (ref 0.00–0.07)
Basophils Absolute: 0.1 10*3/uL (ref 0.0–0.1)
Basophils Relative: 0 %
Eosinophils Absolute: 0.3 10*3/uL (ref 0.0–0.5)
Eosinophils Relative: 2 %
HCT: 21.1 % — ABNORMAL LOW (ref 36.0–46.0)
Hemoglobin: 7.5 g/dL — ABNORMAL LOW (ref 12.0–15.0)
Immature Granulocytes: 1 %
Lymphocytes Relative: 36 %
Lymphs Abs: 4.4 10*3/uL — ABNORMAL HIGH (ref 0.7–4.0)
MCH: 32.6 pg (ref 26.0–34.0)
MCHC: 35.5 g/dL (ref 30.0–36.0)
MCV: 91.7 fL (ref 80.0–100.0)
Monocytes Absolute: 1.1 10*3/uL — ABNORMAL HIGH (ref 0.1–1.0)
Monocytes Relative: 9 %
Neutro Abs: 6.3 10*3/uL (ref 1.7–7.7)
Neutrophils Relative %: 52 %
Platelet Count: 492 10*3/uL — ABNORMAL HIGH (ref 150–400)
RBC: 2.3 MIL/uL — ABNORMAL LOW (ref 3.87–5.11)
RDW: 21.2 % — ABNORMAL HIGH (ref 11.5–15.5)
WBC Count: 12.2 10*3/uL — ABNORMAL HIGH (ref 4.0–10.5)
nRBC: 3.2 % — ABNORMAL HIGH (ref 0.0–0.2)

## 2021-03-19 LAB — RETICULOCYTES
Immature Retic Fract: 23.9 % — ABNORMAL HIGH (ref 2.3–15.9)
RBC.: 2.34 MIL/uL — ABNORMAL LOW (ref 3.87–5.11)
Retic Count, Absolute: 304.7 10*3/uL — ABNORMAL HIGH (ref 19.0–186.0)
Retic Ct Pct: 13 % — ABNORMAL HIGH (ref 0.4–3.1)

## 2021-03-19 LAB — IRON AND IRON BINDING CAPACITY (CC-WL,HP ONLY)
Iron: 140 ug/dL (ref 28–170)
Saturation Ratios: 40 % — ABNORMAL HIGH (ref 10.4–31.8)
TIBC: 354 ug/dL (ref 250–450)
UIBC: 214 ug/dL (ref 148–442)

## 2021-03-19 LAB — FERRITIN: Ferritin: 107 ng/mL (ref 11–307)

## 2021-03-19 LAB — LACTATE DEHYDROGENASE: LDH: 520 U/L — ABNORMAL HIGH (ref 98–192)

## 2021-03-19 MED ORDER — AMOXICILLIN-POT CLAVULANATE 875-125 MG PO TABS: 1.0000 | ORAL_TABLET | Freq: Two times a day (BID) | ORAL | 0 refills | Status: AC

## 2021-03-19 NOTE — Progress Notes (Signed)
Hematology and Oncology Follow Up Visit  [name] [id number] 03/05/1991 30 y.o. 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 daily     Interim History:  [name] is back for follow-up.  She received back from [place].  She was over there seeing her family.  She was over there over Christmas.  She said it was cold.  She did have a "cold" when she came back.  She has little bit of a cough.  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  I will go ahead and put her on Augmentin 875 mg p.o. twice daily for 7 days.  She is doing well on the Xarelto.  She has had no problems with bleeding..  She has had no nausea or vomiting.  She said will still be moving up to [place].  This will be in April.  She has had no problems with bowels or bladder.  Has been no flareups of the sickle cell..  She has had no leg swelling.  Overall, I would say performance status is ECOG 1.    Medications:  Current Outpatient Medications:    cholecalciferol (VITAMIN D3) 25 MCG (1000 UNIT) tablet, Take 1,000 Units by mouth daily., Disp: , Rfl:    folic acid (FOLVITE) 1 MG tablet, TAKE 1 TABLET BY MOUTH EVERY DAY, Disp: 30 tablet, Rfl: 2   HYDROmorphone (DILAUDID) 4 MG tablet, Take 1 tablet (4 mg total) by mouth every 6 (six) hours as needed for severe pain., Disp: 30 tablet, Rfl: 0   ibuprofen (ADVIL,MOTRIN) 800 MG tablet, Take 1 tablet (800 mg total) by mouth every 8 (eight) hours as needed for moderate pain., Disp: 90 tablet, Rfl: 3   L-glutamine (ENDARI) 5 g PACK Powder Packet, Take 10 g by mouth 2 (two) times daily. (Patient not taking: Reported on 12/07/2020), Disp: 60 packet, Rfl: 5   SUMAtriptan (IMITREX) 50 MG tablet, Take 1 tablet (50 mg total) by mouth every 2 (two) hours as needed for  migraine or headache. May repeat in 2 hours if headache persists or recurs. (Patient not taking: Reported on 12/07/2020), Disp: 10 tablet, Rfl: 3   valACYclovir (VALTREX) 500 MG tablet, TAKE 1 TABLET BY MOUTH TWICE A DAY AS NEEDED FOR OUTBREAK (Patient not taking: No sig reported), Disp: 60 tablet, Rfl: 3   XARELTO 20 MG TABS tablet, TAKE 1 TABLET BY MOUTH DAILY WITH SUPPER., Disp: 30 tablet, Rfl: 2  Allergies:  Allergies  Allergen Reactions   Ibuprofen Other (See Comments)    Due to other medications she takes for Lupus    Past Medical History, Surgical history, Social history, and Family History were reviewed and updated.  Review of Systems: Review of Systems  Constitutional: Negative.   HENT:  Negative.    Eyes: Negative.   Respiratory: Negative.    Cardiovascular: Negative.   Gastrointestinal: Negative.   Endocrine: Negative.   Genitourinary: Negative.    Musculoskeletal: Negative.   Skin: Negative.   Neurological: Negative.   Hematological: Negative.   Psychiatric/Behavioral: Negative.     Physical Exam:  weight is 166 lb (75.3 kg). Her oral temperature is 98.1 F (36.7 C). Her blood pressure is 101/44 (abnormal) and her pulse is 69. Her respiration is 16 and oxygen saturation is 96%.   Wt  Readings from Last 3 Encounters:  03/19/21 166 lb (75.3 kg)  12/07/20 167 lb 12.8 oz (76.1 kg)  09/08/20 158 lb 12 oz (72 kg)    Physical Exam Vitals reviewed.  HENT:     Head: Normocephalic and atraumatic.  Eyes:     Pupils: Pupils are equal, round, and reactive to light.  Cardiovascular:     Rate and Rhythm: Normal rate and regular rhythm.     Heart sounds: Normal heart sounds.  Pulmonary:     Effort: Pulmonary effort is normal.     Breath sounds: Normal breath sounds.  Abdominal:     General: Bowel sounds are normal.     Palpations: Abdomen is soft.  Musculoskeletal:        General: No tenderness or deformity. Normal range of motion.     Cervical back: Normal range of  motion.  Lymphadenopathy:     Cervical: No cervical adenopathy.  Skin:    General: Skin is warm and dry.     Findings: No erythema or rash.  Neurological:     Mental Status: She is alert and oriented to person, place, and time.  Psychiatric:        Behavior: Behavior normal.        Thought Content: Thought content normal.        Judgment: Judgment normal.     Lab Results  Component Value Date   WBC 12.2 (H) 03/19/2021   HGB 7.5 (L) 03/19/2021   HCT 21.1 (L) 03/19/2021   MCV 91.7 03/19/2021   PLT 492 (H) 03/19/2021     Chemistry      Component Value Date/Time   NA 139 03/19/2021 0842   NA 136 07/18/2016 1411   NA 136 12/05/2014 0916   K 4.2 03/19/2021 0842   K 4.0 07/18/2016 1411   K 3.9 12/05/2014 0916   CL 109 03/19/2021 0842   CL 105 07/18/2016 1411   CL 109 (H) 12/05/2014 0916   CO2 24 03/19/2021 0842   CO2 23 07/18/2016 1411   CO2 24 12/05/2014 0916   BUN 10 03/19/2021 0842   BUN 8 07/18/2016 1411   BUN 6 (L) 12/05/2014 0916   CREATININE 0.53 03/19/2021 0842   CREATININE 0.53 (L) 07/18/2016 1411   CREATININE 0.5 (L) 12/05/2014 0916      Component Value Date/Time   CALCIUM 9.2 03/19/2021 0842   CALCIUM 9.1 07/18/2016 1411   CALCIUM 8.6 12/05/2014 0916   ALKPHOS 55 03/19/2021 0842   ALKPHOS 74 07/18/2016 1411   ALKPHOS 79 12/05/2014 0916   AST 30 03/19/2021 0842   ALT 11 03/19/2021 0842   ALT 31 12/05/2014 0916   BILITOT 3.1 (H) 03/19/2021 0842     Impression and Plan: [name] is a very nice 30 year old African-American female.  She has Hemoglobin SS disease.  I am so glad that she had a wonderful time in [place].  I know this means a lot to her to be able to see family over in [place].  Of note, we last saw her back in September, her iron saturation was 41%.  Her bilirubin is not as high today.  It is 3.1.  Reticulocyte count is 13%.  I think this is holding pretty steady for her.  I would like to get her back before she goes up to [place].  I  will plan to get her back in March.      [name], MD 12/30/20229:46 AM

## 2021-03-19 NOTE — Telephone Encounter (Signed)
Contacted patient to schedule per los 12/30. Patient is aware of appt on 3/31.

## 2021-03-24 LAB — HGB FRACTIONATION CASCADE: Hgb A2: 3.1 % (ref 1.8–3.2)

## 2021-03-24 LAB — HGB FRAC BY HPLC+SOLUBILITY
Hgb A: 0 % — ABNORMAL LOW (ref 96.4–98.8)
Hgb C: 0 %
Hgb E: 0 %
Hgb F: 6.2 % — ABNORMAL HIGH (ref 0.0–2.0)
Hgb S: 90.7 % — ABNORMAL HIGH
Hgb Solubility: POSITIVE — AB
Hgb Variant: 0 %

## 2021-06-18 ENCOUNTER — Ambulatory Visit: Payer: BC Managed Care – PPO | Admitting: Hematology & Oncology

## 2021-06-18 ENCOUNTER — Other Ambulatory Visit: Payer: BC Managed Care – PPO

## 2021-07-23 ENCOUNTER — Inpatient Hospital Stay: Payer: BC Managed Care – PPO

## 2021-07-23 ENCOUNTER — Inpatient Hospital Stay: Payer: BC Managed Care – PPO | Admitting: Hematology & Oncology

## 2021-08-20 ENCOUNTER — Ambulatory Visit: Payer: BC Managed Care – PPO | Admitting: Hematology & Oncology

## 2021-08-20 ENCOUNTER — Inpatient Hospital Stay: Payer: BC Managed Care – PPO

## 2021-09-15 ENCOUNTER — Other Ambulatory Visit: Payer: Self-pay

## 2021-09-15 ENCOUNTER — Encounter: Payer: Self-pay | Admitting: Hematology & Oncology

## 2021-09-15 ENCOUNTER — Inpatient Hospital Stay: Payer: BC Managed Care – PPO | Admitting: Hematology & Oncology

## 2021-09-15 ENCOUNTER — Inpatient Hospital Stay: Payer: BC Managed Care – PPO | Attending: Hematology & Oncology

## 2021-09-15 VITALS — BP 108/51 | HR 62 | Temp 98.5°F | Resp 18 | Wt 162.0 lb

## 2021-09-15 DIAGNOSIS — D571 Sickle-cell disease without crisis: Secondary | ICD-10-CM

## 2021-09-15 DIAGNOSIS — I2699 Other pulmonary embolism without acute cor pulmonale: Secondary | ICD-10-CM | POA: Insufficient documentation

## 2021-09-15 DIAGNOSIS — D5701 Hb-SS disease with acute chest syndrome: Secondary | ICD-10-CM

## 2021-09-15 DIAGNOSIS — Z7901 Long term (current) use of anticoagulants: Secondary | ICD-10-CM | POA: Diagnosis not present

## 2021-09-15 LAB — RETICULOCYTES
Immature Retic Fract: 28.6 % — ABNORMAL HIGH (ref 2.3–15.9)
RBC.: 2.25 MIL/uL — ABNORMAL LOW (ref 3.87–5.11)
Retic Count, Absolute: 458 10*3/uL — ABNORMAL HIGH (ref 19.0–186.0)
Retic Ct Pct: 20.4 % — ABNORMAL HIGH (ref 0.4–3.1)

## 2021-09-15 LAB — CMP (CANCER CENTER ONLY)
ALT: 13 U/L (ref 0–44)
AST: 34 U/L (ref 15–41)
Albumin: 4.5 g/dL (ref 3.5–5.0)
Alkaline Phosphatase: 57 U/L (ref 38–126)
Anion gap: 6 (ref 5–15)
BUN: 9 mg/dL (ref 6–20)
CO2: 24 mmol/L (ref 22–32)
Calcium: 9.4 mg/dL (ref 8.9–10.3)
Chloride: 107 mmol/L (ref 98–111)
Creatinine: 0.57 mg/dL (ref 0.44–1.00)
GFR, Estimated: >60 mL/min (ref >60)
Glucose, Bld: 86 mg/dL (ref 70–99)
Potassium: 4.4 mmol/L (ref 3.5–5.1)
Sodium: 137 mmol/L (ref 135–145)
Total Bilirubin: 3.4 mg/dL — ABNORMAL HIGH (ref 0.3–1.2)
Total Protein: 7.7 g/dL (ref 6.5–8.1)

## 2021-09-15 LAB — CBC WITH DIFFERENTIAL (CANCER CENTER ONLY)
Abs Immature Granulocytes: 0.12 10*3/uL — ABNORMAL HIGH (ref 0.00–0.07)
Basophils Absolute: 0.1 10*3/uL (ref 0.0–0.1)
Basophils Relative: 0 %
Eosinophils Absolute: 0.2 10*3/uL (ref 0.0–0.5)
Eosinophils Relative: 2 %
HCT: 20.3 % — ABNORMAL LOW (ref 36.0–46.0)
Hemoglobin: 7.3 g/dL — ABNORMAL LOW (ref 12.0–15.0)
Immature Granulocytes: 1 %
Lymphocytes Relative: 38 %
Lymphs Abs: 4.7 10*3/uL — ABNORMAL HIGH (ref 0.7–4.0)
MCH: 33.8 pg (ref 26.0–34.0)
MCHC: 36 g/dL (ref 30.0–36.0)
MCV: 94 fL (ref 80.0–100.0)
Monocytes Absolute: 1.3 10*3/uL — ABNORMAL HIGH (ref 0.1–1.0)
Monocytes Relative: 10 %
Neutro Abs: 6.2 10*3/uL (ref 1.7–7.7)
Neutrophils Relative %: 49 %
Platelet Count: 427 10*3/uL — ABNORMAL HIGH (ref 150–400)
RBC: 2.16 MIL/uL — ABNORMAL LOW (ref 3.87–5.11)
RDW: 23.5 % — ABNORMAL HIGH (ref 11.5–15.5)
WBC Count: 12.6 10*3/uL — ABNORMAL HIGH (ref 4.0–10.5)
nRBC: 1.1 % — ABNORMAL HIGH (ref 0.0–0.2)

## 2021-09-15 LAB — IRON AND IRON BINDING CAPACITY (CC-WL,HP ONLY)
Iron: 105 ug/dL (ref 28–170)
Saturation Ratios: 27 % (ref 10.4–31.8)
TIBC: 389 ug/dL (ref 250–450)
UIBC: 284 ug/dL (ref 148–442)

## 2021-09-15 LAB — FERRITIN: Ferritin: 46 ng/mL (ref 11–307)

## 2021-09-15 LAB — LACTATE DEHYDROGENASE: LDH: 574 U/L — ABNORMAL HIGH (ref 98–192)

## 2021-09-15 NOTE — Progress Notes (Signed)
Hematology and Oncology Follow Up Visit  [name] [id number] 1990/10/05 31 y.o. 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 daily     Interim History:  [name] is back for follow-up.  She now is limited by [place].  She moved up there May 1.  She really is enjoying it up there.  She is quite busy.  Thankfully, she can work from home.  She is now about to go on a trip.  She is first going out to [place] for couple weeks to see her mom.  After that, she will be going over across the [place] to [place] and [place].  I know this can be hot over there.  I told her to make sure that she hydrates quite a bit while she is traveling..  She is on Xarelto.  She is doing well on the Xarelto.  She will stay on Xarelto..  She is on folic acid.  She is doing well on the folic acid.  She is having no problems with bony pain.  She is up no change in bowel or bladder habits.  She is having no cough or shortness of breath.  There is no fever.  Overall, I would say performance status is probably ECOG 0.    Medications:  Current Outpatient Medications:    cholecalciferol (VITAMIN D3) 25 MCG (1000 UNIT) tablet, Take 1,000 Units by mouth daily., Disp: , Rfl:    folic acid (FOLVITE) 1 MG tablet, TAKE 1 TABLET BY MOUTH EVERY DAY, Disp: 30 tablet, Rfl: 2   HYDROmorphone (DILAUDID) 4 MG tablet, Take 1 tablet (4 mg total) by mouth every 6 (six) hours as needed for severe pain., Disp: 30 tablet, Rfl: 0   ibuprofen (ADVIL,MOTRIN) 800 MG tablet, Take 1 tablet (800 mg total) by mouth every 8 (eight) hours as needed for moderate pain., Disp: 90 tablet, Rfl: 3   L-glutamine (ENDARI) 5 g PACK Powder Packet, Take 10 g by mouth 2 (two) times daily. (Patient not taking: Reported on 12/07/2020), Disp: 60 packet, Rfl: 5   SUMAtriptan (IMITREX) 50 MG tablet, Take 1 tablet (50 mg total) by mouth every 2 (two)  hours as needed for migraine or headache. May repeat in 2 hours if headache persists or recurs. (Patient not taking: Reported on 12/07/2020), Disp: 10 tablet, Rfl: 3   valACYclovir (VALTREX) 500 MG tablet, TAKE 1 TABLET BY MOUTH TWICE A DAY AS NEEDED FOR OUTBREAK (Patient not taking: No sig reported), Disp: 60 tablet, Rfl: 3   XARELTO 20 MG TABS tablet, TAKE 1 TABLET BY MOUTH DAILY WITH SUPPER., Disp: 30 tablet, Rfl: 2  Allergies:  Allergies  Allergen Reactions   Ibuprofen Other (See Comments)    Due to other medications she takes for Lupus    Past Medical History, Surgical history, Social history, and Family History were reviewed and updated.  Review of Systems: Review of Systems  Constitutional: Negative.   HENT:  Negative.    Eyes: Negative.   Respiratory: Negative.    Cardiovascular: Negative.   Gastrointestinal: Negative.   Endocrine: Negative.   Genitourinary: Negative.    Musculoskeletal: Negative.   Skin: Negative.   Neurological: Negative.   Hematological: Negative.   Psychiatric/Behavioral: Negative.      Physical Exam:  weight is 162 lb (73.5 kg). Her oral temperature is 98.5 F (36.9 C). Her blood pressure is 108/51 (abnormal) and  her pulse is 62. Her respiration is 18 and oxygen saturation is 98%.   Wt Readings from Last 3 Encounters:  09/15/21 162 lb (73.5 kg)  03/19/21 166 lb (75.3 kg)  12/07/20 167 lb 12.8 oz (76.1 kg)    Physical Exam Vitals reviewed.  HENT:     Head: Normocephalic and atraumatic.  Eyes:     Pupils: Pupils are equal, round, and reactive to light.  Cardiovascular:     Rate and Rhythm: Normal rate and regular rhythm.     Heart sounds: Normal heart sounds.  Pulmonary:     Effort: Pulmonary effort is normal.     Breath sounds: Normal breath sounds.  Abdominal:     General: Bowel sounds are normal.     Palpations: Abdomen is soft.  Musculoskeletal:        General: No tenderness or deformity. Normal range of motion.     Cervical  back: Normal range of motion.  Lymphadenopathy:     Cervical: No cervical adenopathy.  Skin:    General: Skin is warm and dry.     Findings: No erythema or rash.  Neurological:     Mental Status: She is alert and oriented to person, place, and time.  Psychiatric:        Behavior: Behavior normal.        Thought Content: Thought content normal.        Judgment: Judgment normal.      Lab Results  Component Value Date   WBC 12.6 (H) 09/15/2021   HGB 7.3 (L) 09/15/2021   HCT 20.3 (L) 09/15/2021   MCV 94.0 09/15/2021   PLT 427 (H) 09/15/2021     Chemistry      Component Value Date/Time   NA 139 03/19/2021 0842   NA 136 07/18/2016 1411   NA 136 12/05/2014 0916   K 4.2 03/19/2021 0842   K 4.0 07/18/2016 1411   K 3.9 12/05/2014 0916   CL 109 03/19/2021 0842   CL 105 07/18/2016 1411   CL 109 (H) 12/05/2014 0916   CO2 24 03/19/2021 0842   CO2 23 07/18/2016 1411   CO2 24 12/05/2014 0916   BUN 10 03/19/2021 0842   BUN 8 07/18/2016 1411   BUN 6 (L) 12/05/2014 0916   CREATININE 0.53 03/19/2021 0842   CREATININE 0.53 (L) 07/18/2016 1411   CREATININE 0.5 (L) 12/05/2014 0916      Component Value Date/Time   CALCIUM 9.2 03/19/2021 0842   CALCIUM 9.1 07/18/2016 1411   CALCIUM 8.6 12/05/2014 0916   ALKPHOS 55 03/19/2021 0842   ALKPHOS 74 07/18/2016 1411   ALKPHOS 79 12/05/2014 0916   AST 30 03/19/2021 0842   ALT 11 03/19/2021 0842   ALT 31 12/05/2014 0916   BILITOT 3.1 (H) 03/19/2021 0842     Impression and Plan: [name] is a very nice 31 year old African-American female.  She has Hemoglobin SS disease.  She is doing pretty well with this.  Her iron studies have been okay.  She is done has not have any problems with iron overload.  I noted that her reticulocyte count was low but on the higher side this visit.  Was 20%.  Her hemoglobin is holding pretty steady.  She was runs a hemoglobin in the 7 range.  Again she is on folic acid.  She will continue on folic acid.  We  will plan to get her back in another 6 months.    I told her that she  needs to take a picture of her next to a camel while she is over in [place].  Whitney Napoleon, MD 6/28/20239:55 AM

## 2021-10-05 ENCOUNTER — Other Ambulatory Visit: Payer: Self-pay | Admitting: Hematology & Oncology

## 2021-10-10 ENCOUNTER — Other Ambulatory Visit: Payer: Self-pay | Admitting: Hematology & Oncology

## 2022-03-18 ENCOUNTER — Ambulatory Visit: Payer: BC Managed Care – PPO | Admitting: Hematology & Oncology

## 2022-03-18 ENCOUNTER — Other Ambulatory Visit: Payer: BC Managed Care – PPO

## 2022-04-01 ENCOUNTER — Other Ambulatory Visit: Payer: BC Managed Care – PPO

## 2022-04-01 ENCOUNTER — Ambulatory Visit: Payer: BC Managed Care – PPO | Admitting: Hematology & Oncology

## 2022-04-15 ENCOUNTER — Inpatient Hospital Stay: Payer: BC Managed Care – PPO | Admitting: Hematology & Oncology

## 2022-04-15 ENCOUNTER — Inpatient Hospital Stay: Payer: BC Managed Care – PPO | Attending: Hematology & Oncology

## 2022-07-15 ENCOUNTER — Inpatient Hospital Stay: Payer: BC Managed Care – PPO | Attending: Hematology & Oncology

## 2022-07-15 ENCOUNTER — Inpatient Hospital Stay: Payer: BC Managed Care – PPO | Admitting: Hematology & Oncology

## 2022-07-15 ENCOUNTER — Encounter: Payer: Self-pay | Admitting: Hematology & Oncology

## 2022-07-15 VITALS — BP 108/48 | HR 73 | Temp 98.8°F | Resp 20 | Ht 68.0 in | Wt 172.1 lb

## 2022-07-15 DIAGNOSIS — Z7901 Long term (current) use of anticoagulants: Secondary | ICD-10-CM | POA: Diagnosis not present

## 2022-07-15 DIAGNOSIS — Z86711 Personal history of pulmonary embolism: Secondary | ICD-10-CM | POA: Insufficient documentation

## 2022-07-15 DIAGNOSIS — D571 Sickle-cell disease without crisis: Secondary | ICD-10-CM

## 2022-07-15 LAB — CBC WITH DIFFERENTIAL (CANCER CENTER ONLY)
Abs Immature Granulocytes: 0.1 10*3/uL — ABNORMAL HIGH (ref 0.00–0.07)
Basophils Absolute: 0.1 10*3/uL (ref 0.0–0.1)
Basophils Relative: 0 %
Eosinophils Absolute: 0.3 10*3/uL (ref 0.0–0.5)
Eosinophils Relative: 2 %
HCT: 17.5 % — ABNORMAL LOW (ref 36.0–46.0)
Hemoglobin: 6.4 g/dL — CL (ref 12.0–15.0)
Immature Granulocytes: 1 %
Lymphocytes Relative: 45 %
Lymphs Abs: 6.7 10*3/uL — ABNORMAL HIGH (ref 0.7–4.0)
MCH: 33 pg (ref 26.0–34.0)
MCHC: 36.6 g/dL — ABNORMAL HIGH (ref 30.0–36.0)
MCV: 90.2 fL (ref 80.0–100.0)
Monocytes Absolute: 1.2 10*3/uL — ABNORMAL HIGH (ref 0.1–1.0)
Monocytes Relative: 8 %
Neutro Abs: 6.7 10*3/uL (ref 1.7–7.7)
Neutrophils Relative %: 44 %
Platelet Count: 313 10*3/uL (ref 150–400)
RBC: 1.94 MIL/uL — ABNORMAL LOW (ref 3.87–5.11)
RDW: 23.6 % — ABNORMAL HIGH (ref 11.5–15.5)
WBC Count: 15 10*3/uL — ABNORMAL HIGH (ref 4.0–10.5)
nRBC: 1 % — ABNORMAL HIGH (ref 0.0–0.2)

## 2022-07-15 LAB — CMP (CANCER CENTER ONLY)
ALT: 11 U/L (ref 0–44)
AST: 45 U/L — ABNORMAL HIGH (ref 15–41)
Albumin: 4.2 g/dL (ref 3.5–5.0)
Alkaline Phosphatase: 51 U/L (ref 38–126)
Anion gap: 9 (ref 5–15)
BUN: 13 mg/dL (ref 6–20)
CO2: 22 mmol/L (ref 22–32)
Calcium: 8.9 mg/dL (ref 8.9–10.3)
Chloride: 103 mmol/L (ref 98–111)
Creatinine: 0.67 mg/dL (ref 0.44–1.00)
GFR, Estimated: >60 mL/min (ref >60)
Glucose, Bld: 83 mg/dL (ref 70–99)
Potassium: 4.5 mmol/L (ref 3.5–5.1)
Sodium: 134 mmol/L — ABNORMAL LOW (ref 135–145)
Total Bilirubin: 3.7 mg/dL (ref 0.3–1.2)
Total Protein: 7.2 g/dL (ref 6.5–8.1)

## 2022-07-15 LAB — RETICULOCYTES
Immature Retic Fract: 17.5 % — ABNORMAL HIGH (ref 2.3–15.9)
RBC.: 2 MIL/uL — ABNORMAL LOW (ref 3.87–5.11)
Retic Count, Absolute: 463 10*3/uL — ABNORMAL HIGH (ref 19.0–186.0)
Retic Ct Pct: 23.2 % — ABNORMAL HIGH (ref 0.4–3.1)

## 2022-07-15 LAB — LACTATE DEHYDROGENASE: LDH: 586 U/L — ABNORMAL HIGH (ref 98–192)

## 2022-07-15 LAB — FERRITIN: Ferritin: 42 ng/mL (ref 11–307)

## 2022-07-15 NOTE — Progress Notes (Signed)
Hematology and Oncology Follow Up Visit  [name] [id number] 09-Sep-1990 32 y.o. 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 daily     Interim History:  [name] is back for follow-up.  It has been a while since we last saw her.  I think we last saw her back in June 2023.  She is still living up in [place].  She is quite busy up there.  She is even more busy now because her mom, who lives in [place], has breast cancer.  She has not yet started chemotherapy.  I did recommend that Ms. Deschepper do get tested genetically.  She will do this up in [place].  She feels okay.  She does feel a little bit tired.  She has had no problems with bony pain.  She is gained a little weight since we last saw her.  She is not exercising as much because of her work and traveling.  She has had no fever.  She has had no COVID.  She thinks she may have had a "cold" when she came back from [place].  She has had no rashes.  There is been no leg swelling.  There is been no change in bowel or bladder habits.  She still has her monthly cycles.  She is on Xarelto.  She is doing well on the Xarelto.  She has had no chest pain.  She has had no obvious bleeding, outside of that with her monthly cycle.  Overall, I would say that her performance status is probably ECOG 1.   Medications:  Current Outpatient Medications:    cholecalciferol (VITAMIN D3) 25 MCG (1000 UNIT) tablet, Take 1,000 Units by mouth daily., Disp: , Rfl:    folic acid (FOLVITE) 1 MG tablet, TAKE 1 TABLET BY MOUTH EVERY DAY, Disp: 30 tablet, Rfl: 2   ibuprofen (ADVIL,MOTRIN) 800 MG tablet, Take 1 tablet (800 mg total) by mouth every 8 (eight) hours as needed for moderate pain., Disp: 90 tablet, Rfl: 3   XARELTO 20 MG TABS tablet, TAKE 1 TABLET BY MOUTH DAILY WITH SUPPER, Disp: 30 tablet, Rfl: 2   HYDROmorphone (DILAUDID) 4 MG tablet, Take 1  tablet (4 mg total) by mouth every 6 (six) hours as needed for severe pain. (Patient not taking: Reported on 07/15/2022), Disp: 30 tablet, Rfl: 0   SUMAtriptan (IMITREX) 50 MG tablet, Take 1 tablet (50 mg total) by mouth every 2 (two) hours as needed for migraine or headache. May repeat in 2 hours if headache persists or recurs. (Patient not taking: Reported on 12/07/2020), Disp: 10 tablet, Rfl: 3   valACYclovir (VALTREX) 500 MG tablet, TAKE 1 TABLET BY MOUTH TWICE A DAY AS NEEDED FOR OUTBREAK (Patient not taking: Reported on 09/08/2020), Disp: 60 tablet, Rfl: 3  Allergies:  Allergies  Allergen Reactions   Ibuprofen Other (See Comments)    Due to other medications she takes for Lupus    Past Medical History, Surgical history, Social history, and Family History were reviewed and updated.  Review of Systems: Review of Systems  Constitutional: Negative.   HENT:  Negative.    Eyes: Negative.   Respiratory: Negative.    Cardiovascular: Negative.   Gastrointestinal: Negative.   Endocrine: Negative.   Genitourinary: Negative.    Musculoskeletal: Negative.   Skin: Negative.   Neurological: Negative.   Hematological: Negative.   Psychiatric/Behavioral: Negative.  Physical Exam:  height is 5\' 8"  (1.727 m) and weight is 172 lb 1.9 oz (78.1 kg). Her oral temperature is 98.8 F (37.1 C). Her blood pressure is 108/48 (abnormal) and her pulse is 73. Her respiration is 20 and oxygen saturation is 94%.   Wt Readings from Last 3 Encounters:  07/15/22 172 lb 1.9 oz (78.1 kg)  09/15/21 162 lb (73.5 kg)  03/19/21 166 lb (75.3 kg)    Physical Exam Vitals reviewed.  HENT:     Head: Normocephalic and atraumatic.  Eyes:     Pupils: Pupils are equal, round, and reactive to light.  Cardiovascular:     Rate and Rhythm: Normal rate and regular rhythm.     Heart sounds: Normal heart sounds.  Pulmonary:     Effort: Pulmonary effort is normal.     Breath sounds: Normal breath sounds.  Abdominal:      General: Bowel sounds are normal.     Palpations: Abdomen is soft.  Musculoskeletal:        General: No tenderness or deformity. Normal range of motion.     Cervical back: Normal range of motion.  Lymphadenopathy:     Cervical: No cervical adenopathy.  Skin:    General: Skin is warm and dry.     Findings: No erythema or rash.  Neurological:     Mental Status: She is alert and oriented to person, place, and time.  Psychiatric:        Behavior: Behavior normal.        Thought Content: Thought content normal.        Judgment: Judgment normal.     Lab Results  Component Value Date   WBC 12.6 (H) 09/15/2021   HGB 7.3 (L) 09/15/2021   HCT 20.3 (L) 09/15/2021   MCV 94.0 09/15/2021   PLT 427 (H) 09/15/2021     Chemistry      Component Value Date/Time   NA 137 09/15/2021 0918   NA 136 07/18/2016 1411   NA 136 12/05/2014 0916   K 4.4 09/15/2021 0918   K 4.0 07/18/2016 1411   K 3.9 12/05/2014 0916   CL 107 09/15/2021 0918   CL 105 07/18/2016 1411   CL 109 (H) 12/05/2014 0916   CO2 24 09/15/2021 0918   CO2 23 07/18/2016 1411   CO2 24 12/05/2014 0916   BUN 9 09/15/2021 0918   BUN 8 07/18/2016 1411   BUN 6 (L) 12/05/2014 0916   CREATININE 0.57 09/15/2021 0918   CREATININE 0.53 (L) 07/18/2016 1411   CREATININE 0.5 (L) 12/05/2014 0916      Component Value Date/Time   CALCIUM 9.4 09/15/2021 0918   CALCIUM 9.1 07/18/2016 1411   CALCIUM 8.6 12/05/2014 0916   ALKPHOS 57 09/15/2021 0918   ALKPHOS 74 07/18/2016 1411   ALKPHOS 79 12/05/2014 0916   AST 34 09/15/2021 0918   ALT 13 09/15/2021 0918   ALT 31 12/05/2014 0916   BILITOT 3.4 (H) 09/15/2021 0918     Impression and Plan: [name] is a very nice 32 year old African-American female.  She has Hemoglobin SS disease.  I am really troubled by the fact that her reticulocyte count is still high.  She is taking her folic acid.  Her hemoglobin is low, which is normal for her.  She does not have any problems with sickle  crisis.  There is no issues that would suggest acute chest syndrome.  I told her that she really needs to be careful.  She is going drop back up to [place] today.  It would be nice if she did have some doctor of there that she could see if she had a problem.  She is going to try to find a family doctor to see.  I told her that she can always call us if she has any issues.  Again, I told her to make sure she takes her folic acid.  I told her to make sure that she stays well-hydrated.  Of note, her iron studies look okay.  When we last saw her, her ferritin was only 46 with an iron saturation of 27%.  Today, her bilirubin was 3.7 which actually is not all that bad for her.  We probably will see her back in 6 months.  Again, I told her if she has any problems she was give [place] a call.     [name], MD 4/26/20244:19 PM

## 2022-07-18 LAB — IRON AND IRON BINDING CAPACITY (CC-WL,HP ONLY)
Iron: 122 ug/dL (ref 28–170)
Saturation Ratios: 34 % — ABNORMAL HIGH (ref 10.4–31.8)
TIBC: 360 ug/dL (ref 250–450)
UIBC: 238 ug/dL (ref 148–442)

## 2022-07-21 LAB — HGB FRACTIONATION CASCADE: Hgb A2: 3.1 % (ref 1.8–3.2)

## 2022-07-21 LAB — HGB FRAC BY HPLC+SOLUBILITY
Hgb A: 0 % — ABNORMAL LOW (ref 96.4–98.8)
Hgb C: 0 %
Hgb E: 0 %
Hgb F: 6.4 % — ABNORMAL HIGH (ref 0.0–2.0)
Hgb S: 90.5 % — ABNORMAL HIGH
Hgb Solubility: POSITIVE — AB
Hgb Variant: 0 %

## 2022-07-23 ENCOUNTER — Other Ambulatory Visit: Payer: Self-pay | Admitting: Hematology & Oncology

## 2023-04-20 ENCOUNTER — Other Ambulatory Visit: Payer: Self-pay

## 2023-04-20 MED ORDER — RIVAROXABAN 20 MG PO TABS: 20.0000 mg | ORAL_TABLET | Freq: Every day | ORAL | 0 refills | Status: AC

## 2023-04-21 ENCOUNTER — Telehealth: Payer: Self-pay | Admitting: Hematology & Oncology

## 2023-04-21 NOTE — Telephone Encounter (Signed)
Called pt to schedule follow up appt. Pt noted that they were currently hospitalized and would like to call back when they were ready to schedule.
